# Patient Record
Sex: Female | Born: 1980 | Hispanic: Yes | Marital: Married | Smoking: Never smoker
Health system: Southern US, Community
[De-identification: ages and names within clinical notes are randomized; demographics above are authoritative.]

## PROBLEM LIST (undated history)

## (undated) DIAGNOSIS — R9389 Abnormal findings on diagnostic imaging of other specified body structures: Secondary | ICD-10-CM

## (undated) DIAGNOSIS — O24419 Gestational diabetes mellitus in pregnancy, unspecified control: Secondary | ICD-10-CM

## (undated) HISTORY — DX: Gestational diabetes mellitus in pregnancy, unspecified control: O24.419

## (undated) HISTORY — PX: NO PAST SURGERIES: SHX2092

---
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
## 2002-08-31 ENCOUNTER — Encounter: Admission: RE | Admit: 2002-08-31 | Discharge: 2002-08-31 | Payer: Self-pay | Admitting: Family Medicine

## 2002-09-04 ENCOUNTER — Encounter: Admission: RE | Admit: 2002-09-04 | Discharge: 2002-09-04 | Payer: Self-pay | Admitting: Family Medicine

## 2002-09-13 ENCOUNTER — Encounter (INDEPENDENT_AMBULATORY_CARE_PROVIDER_SITE_OTHER): Payer: Self-pay | Admitting: *Deleted

## 2002-09-13 LAB — CONVERTED CEMR LAB

## 2002-10-12 ENCOUNTER — Ambulatory Visit (HOSPITAL_COMMUNITY): Admission: RE | Admit: 2002-10-12 | Discharge: 2002-10-12 | Payer: Self-pay | Admitting: Family Medicine

## 2002-11-03 ENCOUNTER — Encounter: Admission: RE | Admit: 2002-11-03 | Discharge: 2002-11-03 | Payer: Self-pay | Admitting: Family Medicine

## 2002-12-04 ENCOUNTER — Encounter: Admission: RE | Admit: 2002-12-04 | Discharge: 2002-12-04 | Payer: Self-pay | Admitting: Family Medicine

## 2002-12-05 ENCOUNTER — Encounter: Admission: RE | Admit: 2002-12-05 | Discharge: 2002-12-05 | Payer: Self-pay | Admitting: Family Medicine

## 2003-01-04 ENCOUNTER — Encounter: Admission: RE | Admit: 2003-01-04 | Discharge: 2003-01-04 | Payer: Self-pay | Admitting: Family Medicine

## 2003-01-15 ENCOUNTER — Encounter: Admission: RE | Admit: 2003-01-15 | Discharge: 2003-01-15 | Payer: Self-pay | Admitting: Pediatrics

## 2003-01-29 ENCOUNTER — Ambulatory Visit (HOSPITAL_COMMUNITY): Admission: RE | Admit: 2003-01-29 | Discharge: 2003-01-29 | Payer: Self-pay | Admitting: Family Medicine

## 2003-01-29 ENCOUNTER — Encounter: Admission: RE | Admit: 2003-01-29 | Discharge: 2003-01-29 | Payer: Self-pay | Admitting: Family Medicine

## 2003-02-07 ENCOUNTER — Encounter: Admission: RE | Admit: 2003-02-07 | Discharge: 2003-02-07 | Payer: Self-pay | Admitting: Family Medicine

## 2003-02-21 ENCOUNTER — Encounter: Admission: RE | Admit: 2003-02-21 | Discharge: 2003-02-21 | Payer: Self-pay | Admitting: Family Medicine

## 2003-02-28 ENCOUNTER — Encounter: Admission: RE | Admit: 2003-02-28 | Discharge: 2003-02-28 | Payer: Self-pay | Admitting: Family Medicine

## 2003-03-07 ENCOUNTER — Encounter: Admission: RE | Admit: 2003-03-07 | Discharge: 2003-03-07 | Payer: Self-pay | Admitting: Family Medicine

## 2003-03-14 ENCOUNTER — Encounter: Admission: RE | Admit: 2003-03-14 | Discharge: 2003-03-14 | Payer: Self-pay | Admitting: Family Medicine

## 2003-03-20 ENCOUNTER — Inpatient Hospital Stay (HOSPITAL_COMMUNITY): Admission: AD | Admit: 2003-03-20 | Discharge: 2003-03-20 | Payer: Self-pay | Admitting: Obstetrics and Gynecology

## 2003-03-20 ENCOUNTER — Encounter: Admission: RE | Admit: 2003-03-20 | Discharge: 2003-03-20 | Payer: Self-pay | Admitting: Sports Medicine

## 2003-03-26 ENCOUNTER — Encounter (HOSPITAL_COMMUNITY): Admission: RE | Admit: 2003-03-26 | Discharge: 2003-03-26 | Payer: Self-pay | Admitting: *Deleted

## 2003-03-27 ENCOUNTER — Encounter: Admission: RE | Admit: 2003-03-27 | Discharge: 2003-03-27 | Payer: Self-pay | Admitting: Family Medicine

## 2003-03-28 ENCOUNTER — Inpatient Hospital Stay (HOSPITAL_COMMUNITY): Admission: AD | Admit: 2003-03-28 | Discharge: 2003-03-31 | Payer: Self-pay | Admitting: Obstetrics and Gynecology

## 2003-05-17 ENCOUNTER — Encounter: Admission: RE | Admit: 2003-05-17 | Discharge: 2003-05-17 | Payer: Self-pay | Admitting: Sports Medicine

## 2004-09-02 ENCOUNTER — Ambulatory Visit: Payer: Self-pay | Admitting: Family Medicine

## 2007-02-10 DIAGNOSIS — R002 Palpitations: Secondary | ICD-10-CM | POA: Insufficient documentation

## 2007-02-11 ENCOUNTER — Encounter (INDEPENDENT_AMBULATORY_CARE_PROVIDER_SITE_OTHER): Payer: Self-pay | Admitting: *Deleted

## 2009-12-14 DIAGNOSIS — O24419 Gestational diabetes mellitus in pregnancy, unspecified control: Secondary | ICD-10-CM

## 2009-12-14 HISTORY — DX: Gestational diabetes mellitus in pregnancy, unspecified control: O24.419

## 2009-12-30 ENCOUNTER — Encounter: Admission: RE | Admit: 2009-12-30 | Discharge: 2010-03-30 | Payer: Self-pay | Admitting: Obstetrics & Gynecology

## 2009-12-30 ENCOUNTER — Ambulatory Visit (HOSPITAL_COMMUNITY)
Admission: RE | Admit: 2009-12-30 | Discharge: 2009-12-30 | Payer: Self-pay | Source: Home / Self Care | Admitting: Obstetrics & Gynecology

## 2009-12-30 ENCOUNTER — Ambulatory Visit: Payer: Self-pay | Admitting: Obstetrics & Gynecology

## 2010-01-06 ENCOUNTER — Ambulatory Visit: Payer: Self-pay | Admitting: Obstetrics & Gynecology

## 2010-01-20 ENCOUNTER — Ambulatory Visit: Payer: Self-pay | Admitting: Obstetrics & Gynecology

## 2010-01-21 ENCOUNTER — Ambulatory Visit (HOSPITAL_COMMUNITY): Admission: RE | Admit: 2010-01-21 | Discharge: 2010-01-21 | Payer: Self-pay | Admitting: Family Medicine

## 2010-02-10 ENCOUNTER — Ambulatory Visit: Payer: Self-pay | Admitting: Obstetrics & Gynecology

## 2010-02-24 ENCOUNTER — Ambulatory Visit: Payer: Self-pay | Admitting: Obstetrics & Gynecology

## 2010-03-10 ENCOUNTER — Ambulatory Visit: Payer: Self-pay | Admitting: Obstetrics & Gynecology

## 2010-03-10 ENCOUNTER — Encounter: Payer: Self-pay | Admitting: Obstetrics & Gynecology

## 2010-03-10 LAB — CONVERTED CEMR LAB
Hemoglobin: 11.6 g/dL — ABNORMAL LOW (ref 12.0–15.0)
MCHC: 32.5 g/dL (ref 30.0–36.0)
Platelets: 140 10*3/uL — ABNORMAL LOW (ref 150–400)
WBC: 8.4 10*3/uL (ref 4.0–10.5)

## 2010-03-17 ENCOUNTER — Ambulatory Visit (HOSPITAL_COMMUNITY): Admission: RE | Admit: 2010-03-17 | Discharge: 2010-03-17 | Payer: Self-pay | Admitting: Family Medicine

## 2010-03-24 ENCOUNTER — Ambulatory Visit: Payer: Self-pay | Admitting: Obstetrics & Gynecology

## 2010-03-31 ENCOUNTER — Ambulatory Visit: Payer: Self-pay | Admitting: Obstetrics & Gynecology

## 2010-04-14 ENCOUNTER — Ambulatory Visit (HOSPITAL_COMMUNITY): Admission: RE | Admit: 2010-04-14 | Discharge: 2010-04-14 | Payer: Self-pay | Admitting: Family Medicine

## 2010-04-14 ENCOUNTER — Ambulatory Visit: Payer: Self-pay | Admitting: Obstetrics & Gynecology

## 2010-04-17 ENCOUNTER — Ambulatory Visit: Payer: Self-pay | Admitting: Family Medicine

## 2010-04-21 ENCOUNTER — Ambulatory Visit: Payer: Self-pay | Admitting: Obstetrics & Gynecology

## 2010-04-24 ENCOUNTER — Ambulatory Visit: Payer: Self-pay | Admitting: Obstetrics & Gynecology

## 2010-04-28 ENCOUNTER — Ambulatory Visit: Payer: Self-pay | Admitting: Obstetrics & Gynecology

## 2010-05-01 ENCOUNTER — Ambulatory Visit: Payer: Self-pay | Admitting: Obstetrics & Gynecology

## 2010-05-05 ENCOUNTER — Encounter: Admission: RE | Admit: 2010-05-05 | Discharge: 2010-05-05 | Payer: Self-pay | Admitting: Obstetrics & Gynecology

## 2010-05-05 ENCOUNTER — Ambulatory Visit: Payer: Self-pay | Admitting: Obstetrics & Gynecology

## 2010-05-06 ENCOUNTER — Encounter: Payer: Self-pay | Admitting: Obstetrics & Gynecology

## 2010-05-08 ENCOUNTER — Ambulatory Visit: Payer: Self-pay | Admitting: Obstetrics & Gynecology

## 2010-05-13 ENCOUNTER — Ambulatory Visit: Payer: Self-pay | Admitting: Obstetrics & Gynecology

## 2010-05-15 ENCOUNTER — Ambulatory Visit: Payer: Self-pay | Admitting: Obstetrics & Gynecology

## 2010-05-19 ENCOUNTER — Ambulatory Visit: Payer: Self-pay | Admitting: Obstetrics & Gynecology

## 2010-05-22 ENCOUNTER — Ambulatory Visit: Payer: Self-pay | Admitting: Obstetrics & Gynecology

## 2010-05-26 ENCOUNTER — Ambulatory Visit (HOSPITAL_COMMUNITY): Admission: RE | Admit: 2010-05-26 | Discharge: 2010-05-26 | Payer: Self-pay | Admitting: Family Medicine

## 2010-05-26 ENCOUNTER — Ambulatory Visit: Payer: Self-pay | Admitting: Obstetrics & Gynecology

## 2010-05-29 ENCOUNTER — Ambulatory Visit: Payer: Self-pay | Admitting: Obstetrics & Gynecology

## 2010-05-31 ENCOUNTER — Ambulatory Visit: Payer: Self-pay | Admitting: Family

## 2010-05-31 ENCOUNTER — Inpatient Hospital Stay (HOSPITAL_COMMUNITY): Admission: AD | Admit: 2010-05-31 | Discharge: 2010-06-02 | Payer: Self-pay | Admitting: Family Medicine

## 2010-07-10 ENCOUNTER — Ambulatory Visit: Payer: Self-pay | Admitting: Obstetrics & Gynecology

## 2010-07-16 ENCOUNTER — Ambulatory Visit: Payer: Self-pay | Admitting: Obstetrics and Gynecology

## 2010-12-31 ENCOUNTER — Ambulatory Visit: Admit: 2010-12-31 | Payer: Self-pay | Admitting: Obstetrics & Gynecology

## 2011-03-01 LAB — CBC
HCT: 34.6 % — ABNORMAL LOW (ref 36.0–46.0)
MCHC: 34 g/dL (ref 30.0–36.0)
MCV: 87.3 fL (ref 78.0–100.0)
RDW: 15.3 % (ref 11.5–15.5)
WBC: 8.2 10*3/uL (ref 4.0–10.5)

## 2011-03-01 LAB — POCT URINALYSIS DIP (DEVICE)
Bilirubin Urine: NEGATIVE
Bilirubin Urine: NEGATIVE
Glucose, UA: NEGATIVE mg/dL
Hgb urine dipstick: NEGATIVE
Nitrite: NEGATIVE
Protein, ur: 30 mg/dL — AB
Protein, ur: NEGATIVE mg/dL
Specific Gravity, Urine: 1.015 (ref 1.005–1.030)
Urobilinogen, UA: 0.2 mg/dL (ref 0.0–1.0)
pH: 7 (ref 5.0–8.0)

## 2011-03-01 LAB — GLUCOSE, CAPILLARY: Glucose-Capillary: 135 mg/dL — ABNORMAL HIGH (ref 70–99)

## 2011-03-01 LAB — RPR: RPR Ser Ql: NONREACTIVE

## 2011-03-02 LAB — POCT URINALYSIS DIP (DEVICE)
Bilirubin Urine: NEGATIVE
Bilirubin Urine: NEGATIVE
Bilirubin Urine: NEGATIVE
Glucose, UA: NEGATIVE mg/dL
Glucose, UA: NEGATIVE mg/dL
Hgb urine dipstick: NEGATIVE
Ketones, ur: NEGATIVE mg/dL
Nitrite: NEGATIVE
Protein, ur: NEGATIVE mg/dL
Protein, ur: NEGATIVE mg/dL
Specific Gravity, Urine: 1.015 (ref 1.005–1.030)
Specific Gravity, Urine: 1.02 (ref 1.005–1.030)
Urobilinogen, UA: 0.2 mg/dL (ref 0.0–1.0)
Urobilinogen, UA: 0.2 mg/dL (ref 0.0–1.0)
pH: 7 (ref 5.0–8.0)
pH: 7 (ref 5.0–8.0)
pH: 7 (ref 5.0–8.0)

## 2011-03-03 LAB — POCT URINALYSIS DIP (DEVICE)
Bilirubin Urine: NEGATIVE
Bilirubin Urine: NEGATIVE
Glucose, UA: 100 mg/dL — AB
Glucose, UA: 100 mg/dL — AB
Hgb urine dipstick: NEGATIVE
Hgb urine dipstick: NEGATIVE
Ketones, ur: NEGATIVE mg/dL
Ketones, ur: NEGATIVE mg/dL
Ketones, ur: NEGATIVE mg/dL
Nitrite: NEGATIVE
Nitrite: NEGATIVE
Nitrite: NEGATIVE
Protein, ur: 30 mg/dL — AB
Protein, ur: 30 mg/dL — AB
Specific Gravity, Urine: 1.025 (ref 1.005–1.030)
Specific Gravity, Urine: >=1.03 (ref 1.005–1.030)
Urobilinogen, UA: 0.2 mg/dL (ref 0.0–1.0)
Urobilinogen, UA: 0.2 mg/dL (ref 0.0–1.0)
pH: 6 (ref 5.0–8.0)
pH: 6 (ref 5.0–8.0)
pH: 7 (ref 5.0–8.0)

## 2011-03-04 LAB — POCT URINALYSIS DIP (DEVICE)
Glucose, UA: NEGATIVE mg/dL
Hgb urine dipstick: NEGATIVE
Hgb urine dipstick: NEGATIVE
Hgb urine dipstick: NEGATIVE
Ketones, ur: NEGATIVE mg/dL
Ketones, ur: NEGATIVE mg/dL
Nitrite: NEGATIVE
Protein, ur: NEGATIVE mg/dL
Protein, ur: NEGATIVE mg/dL
Protein, ur: NEGATIVE mg/dL
Specific Gravity, Urine: 1.02 (ref 1.005–1.030)
Urobilinogen, UA: 0.2 mg/dL (ref 0.0–1.0)
Urobilinogen, UA: 0.2 mg/dL (ref 0.0–1.0)
Urobilinogen, UA: 0.2 mg/dL (ref 0.0–1.0)
pH: 7 (ref 5.0–8.0)
pH: 7 (ref 5.0–8.0)

## 2011-03-08 LAB — POCT URINALYSIS DIP (DEVICE)
Bilirubin Urine: NEGATIVE
Bilirubin Urine: NEGATIVE
Ketones, ur: NEGATIVE mg/dL
Nitrite: NEGATIVE
Protein, ur: 30 mg/dL — AB
Specific Gravity, Urine: 1.025 (ref 1.005–1.030)
Urobilinogen, UA: 0.2 mg/dL (ref 0.0–1.0)
pH: 6.5 (ref 5.0–8.0)
pH: 7 (ref 5.0–8.0)

## 2011-09-23 IMAGING — US US OB FOLLOW-UP
1 series · 14 of 22 positions shown · non-contrast
Comparison: none

OBSTETRICAL ULTRASOUND:
 This ultrasound exam was performed in the [HOSPITAL] Ultrasound Department.  The OB US report was generated in the AS system, and faxed to the ordering physician.  This report is also available in [HOSPITAL]?s AccessANYware and in [REDACTED] PACS.

[Series 1: us ob follow up · 0.27mm/px · 14 of 22 slices shown]
[im 1/22]
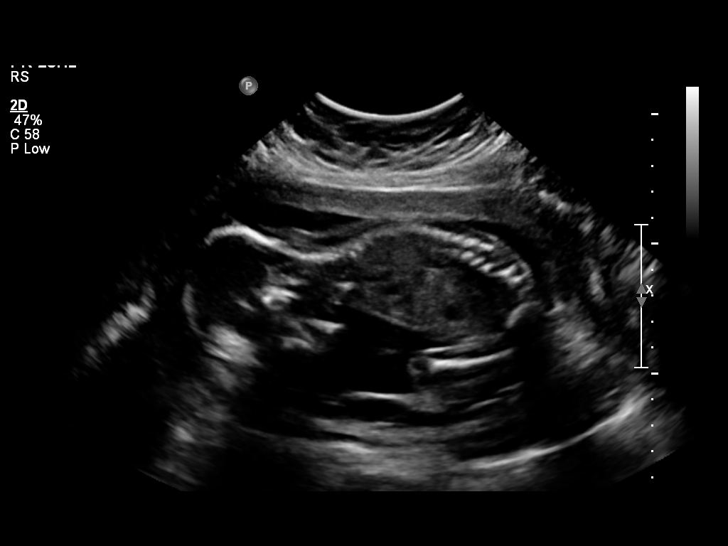
[im 3/22]
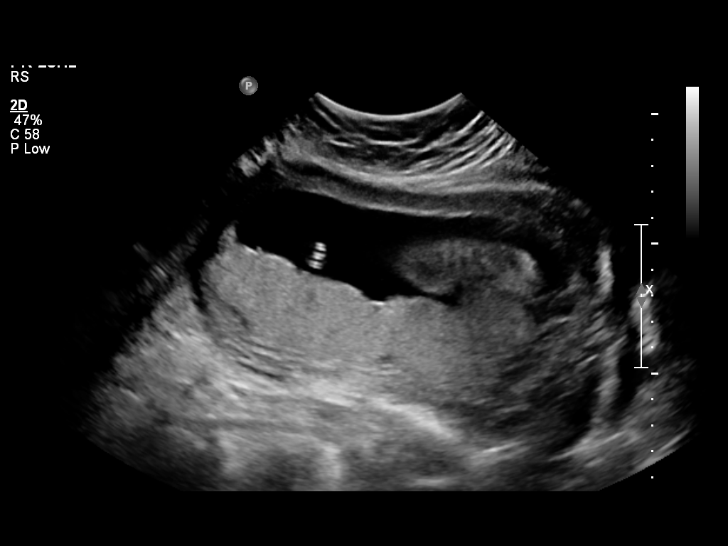
[im 4/22]
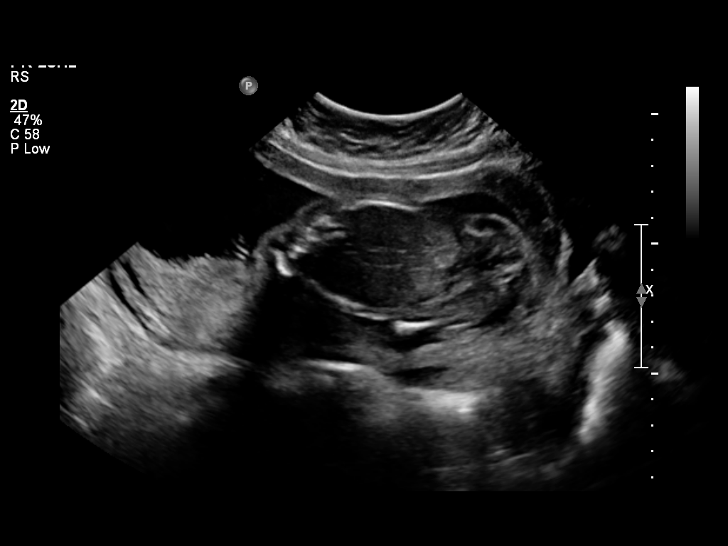
[im 6/22]
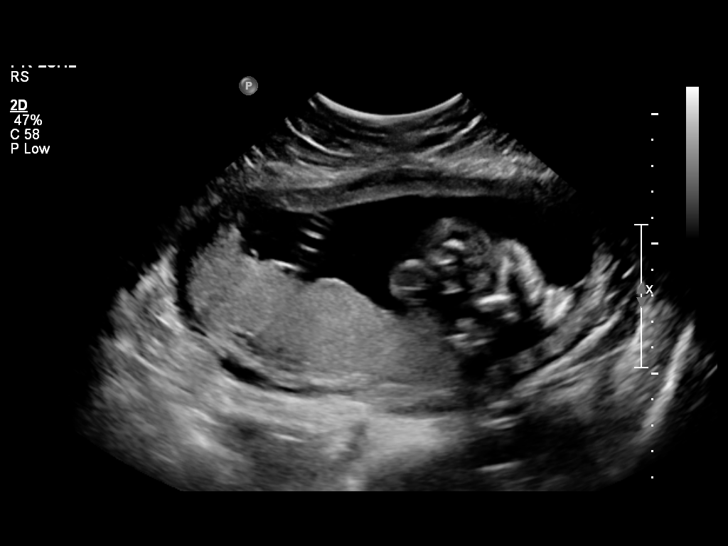
[im 8/22]
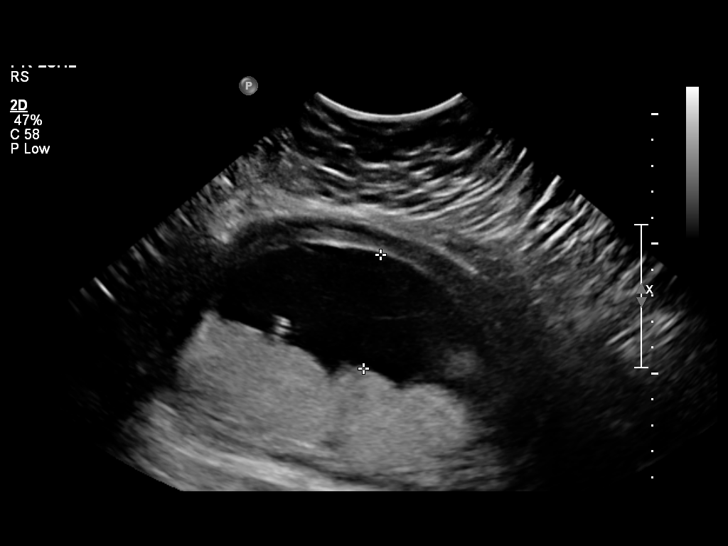
[im 9/22]
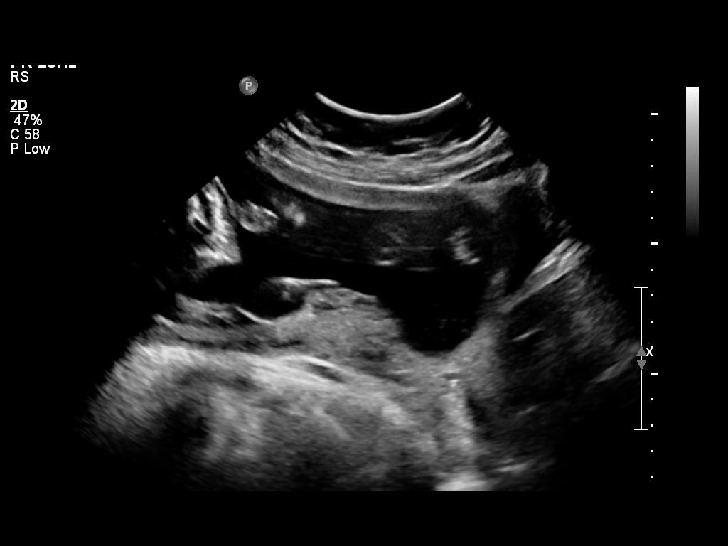
[im 11/22]
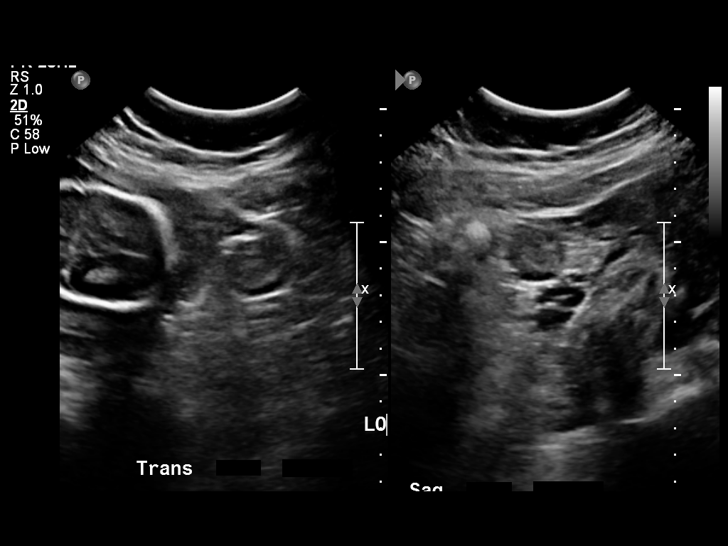
[im 12/22]
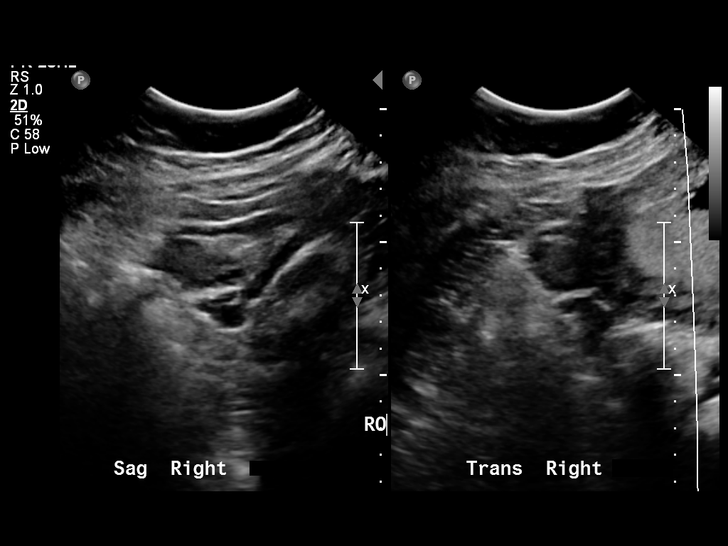
[im 14/22]
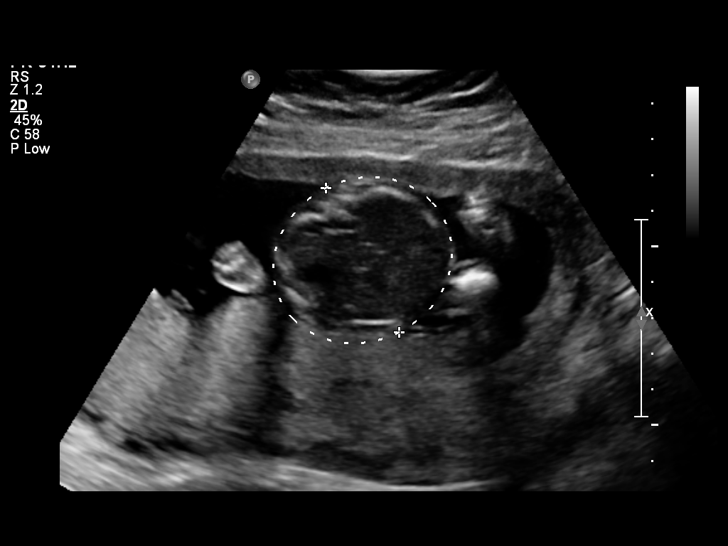
[im 15/22]
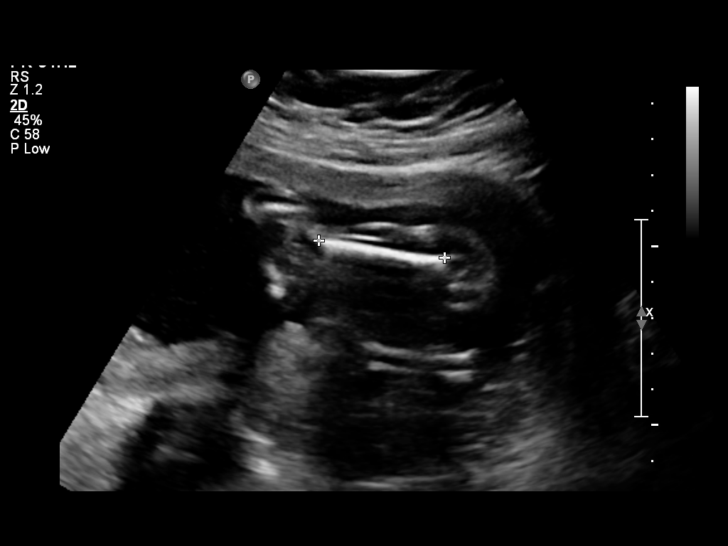
[im 17/22]
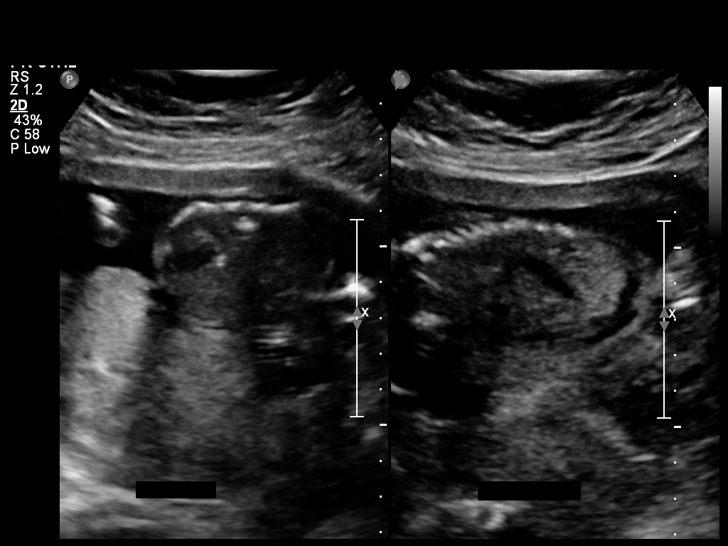
[im 19/22]
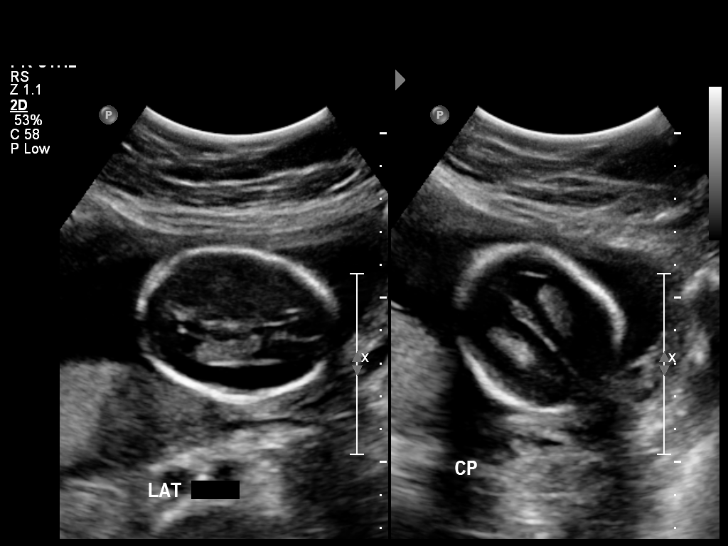
[im 20/22]
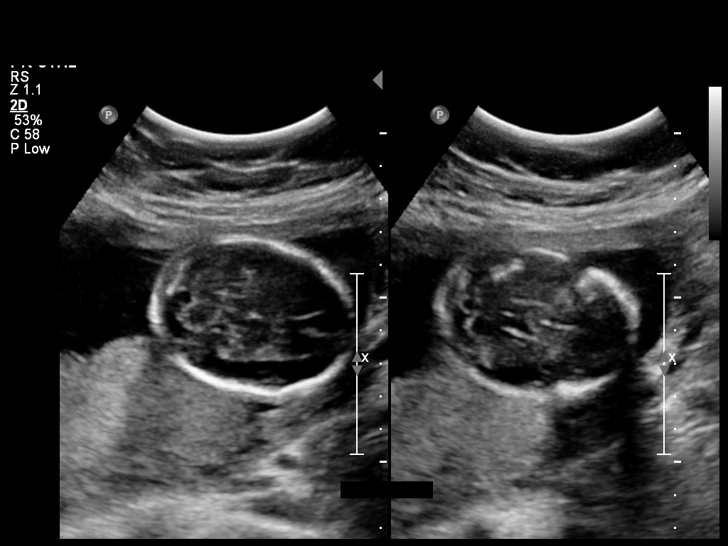
[im 22/22]
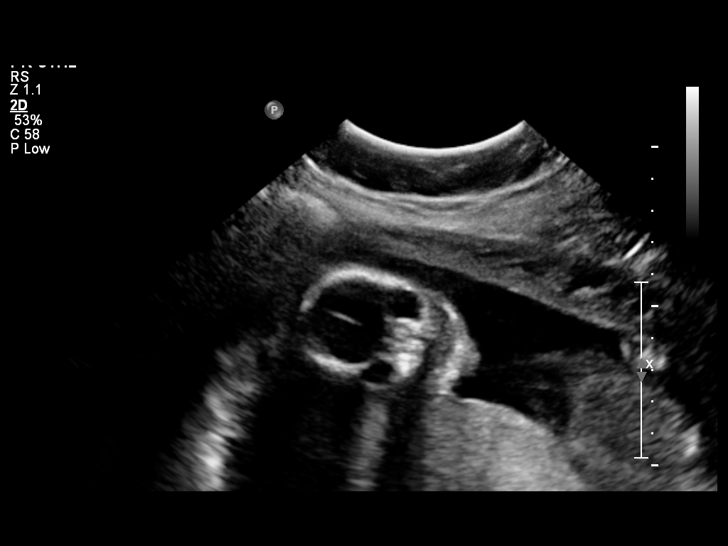

[14 of 22 positions shown; findings below may reference images not displayed]

IMPRESSION: See AS Obstetric US report.

## 2012-05-06 ENCOUNTER — Encounter: Payer: Self-pay | Admitting: Family Medicine

## 2012-05-06 ENCOUNTER — Ambulatory Visit (INDEPENDENT_AMBULATORY_CARE_PROVIDER_SITE_OTHER): Payer: Self-pay | Admitting: Family Medicine

## 2012-05-06 VITALS — BP 109/77 | HR 77 | Wt 160.5 lb

## 2012-05-06 DIAGNOSIS — M25511 Pain in right shoulder: Secondary | ICD-10-CM | POA: Insufficient documentation

## 2012-05-06 DIAGNOSIS — E669 Obesity, unspecified: Secondary | ICD-10-CM | POA: Insufficient documentation

## 2012-05-06 DIAGNOSIS — M25519 Pain in unspecified shoulder: Secondary | ICD-10-CM

## 2012-05-06 NOTE — Progress Notes (Signed)
Subjective:    Patient ID: Miranda Welch, female    DOB: [birth year], 31 y.o.   MRN: [id number]  HPI New patient to establish care. Orange card is pending.  1. Arm pain/numbness. Patient complains of lateral shoulder pain that radiates down to all of her fingers. Present for the last year, stable. Accompanied by numbness and tingling. Exacerbated by certain movements including overhead use and lifting up children. No work with repetitive motions. She denies dropping things, but does feel like the pain worsens with use and she must rest. Denies any trauma, falls, swelling, neck pain, back pain.  2. Hx GDM. Patient had gestational diabetes during last day in 2011. Has family history of diabetes in mother and father. Patient denies any polyuria, polydipsia, fatigue.  3. Obesity. Prior to her second pregnancy she weighed 149 pounds. At her heaviest she was 75, has lost approximately 10 pounds since giving birth 2 years ago. Has recently started walking for exercise. She is not eating a healthy diet, stopped her healthy diet after treatment for gestational diabetes was finished.  Past Medical History  Diagnosis Date  . Gestational diabetes 2011   History reviewed. No pertinent past surgical history.  History   Social History  . Marital Status: Married    Spouse Name: N/A    Number of Children: N/A  . Years of Education: N/A   Occupational History  . Not on file.   Social History Main Topics  . Smoking status: Never Smoker   . Smokeless tobacco: Not on file  . Alcohol Use: No  . Drug Use: No  . Sexually Active: Yes   Other Topics Concern  . Not on file   Social History Narrative   Moved from [place] 10 yrs ago, lives with husband and 2 daughters (2011, 2004). Stay at home mother. Walks for exercise irregularly.   Family History  Problem Relation Age of Onset  . Diabetes Mother   . Hypertension Mother   . Diabetes Father   . Hypertension Father   . Depression Sister     . Hypertension Sister   . Cancer Maternal Uncle     colon  . Diabetes Maternal Grandmother   . Hypertension Maternal Grandfather     Review of Systems See HPI otherwise negative.    Objective:   Physical Exam  Vitals reviewed. Constitutional: She is oriented to person, place, and time. She appears well-developed and well-nourished. No distress.  HENT:  Head: Normocephalic and atraumatic.  Eyes: EOM are normal. Pupils are equal, round, and reactive to light.  Neck: Normal range of motion. Neck supple.  Cardiovascular: Normal rate, regular rhythm, normal heart sounds and intact distal pulses.   No murmur heard. Pulmonary/Chest: Effort normal and breath sounds normal. No respiratory distress. She has no wheezes. She has no rales.  Abdominal: Soft.  Musculoskeletal: Normal range of motion. She exhibits no edema.       Normal shoulder and cervical spine range of motion.  Patient notes mild pain lateral shoulder with overhead reaching and abduction. Tender to palpation in the lateral and anterior aspect of humeral head/shoulder. Has mild pain with active resistance and empty can position, lift off.  Normal strength in biceps flexion, wrist extension and flexion, grip testing. Sensation intact throughout.  Lymphadenopathy:    She has no cervical adenopathy.  Neurological: She is alert and oriented to person, place, and time. Coordination normal.  Psychiatric: She has a normal mood and affect.  Assessment & Plan:

## 2012-05-06 NOTE — Assessment & Plan Note (Signed)
Physical exam most consistent with rotator cuff tendinopathy/shoulder impingement. Other possibilities include cervical radiculopathy or carpal tunnel syndrome with a component numbness and tingling been present. Patient has not tried any modalities for therapy. She is awaiting Orange card approval. Will start with the nature and and have provided some stretching/strengthening for rotator cuff currently. The patient is able to followup with like to obtain plain film of the neck and shoulder to evaluate for radiculopathy versus bone pathology. Possible interventions include referral for physical therapy, sports medicine referral, trial of subacromial injection. Plan to check B12, TSH levels with neuropathy symptoms.

## 2012-05-06 NOTE — Assessment & Plan Note (Signed)
Patient risk for glucose intolerance. Will await financial coverage, plan to obtain baseline labs including A1c, lipid level, TSH. Encouraged patient on recently starting walking for exercise. May also benefit from nutrition referral as she seemed to do well with strict dietary recommendations during her pregnancy.

## 2012-05-06 NOTE — Patient Instructions (Signed)
Nice to meet you. I think your should pain may be due to muscle or tendon impingement/strain.  You may try tylenol or motrin (ibuprofen 400-600mg  three times daily) for the pain.  Please come back after you get orange card so we may order tests and try physical therapy. We will also check lab work to make sure no diabetes. Keep using arm for work and exercising daily.  Ejercicios para los hombros (Shoulder Exercises) EJERCICIOS EJERCICIOS DE AMPLITUD DE MOVIMIENTOS Y ELONGACIN Estos ejercicios le ayudarn en la recuperacin de la lesin. Los sntomas podrn aliviarse con o sin asistencia adicional de su mdico, fisioterapeuta o Herbalist. Al completar estos ejercicios, recuerde:   Restaurar la flexibilidad del tejido ayuda a que las articulaciones recuperen el movimiento normal. Esto permite que el movimiento y la actividad sea ms saludables y menos dolorosos.   Para que sea efectiva, cada elongacin debe realizarse durante al menos 30 segundos.   La elongacin nunca debe ser dolorosa. Deber sentir slo un alargamiento suave o elongacin del tejido que estira.  AMPLITUD DE MOVIMIENTOS Pndulo  Inclnese a nivel de la cintura de modo que su brazo derecha / izquierdo se separe del cuerpo. Sostngase con la otra mano sobre una superficie slida, como una mesa o mesada de cocina.   Su brazo derecha / izquierdo debe quedar perpendicular al [employer]. Si no est perpendicular, debe inclinarse un poco ms. Relaje los msculos del brazo y el hombro derecha / izquierdo tanto como pueda.   Menee suavemente las caderas y el tronco de modo que su brazo Sales executive / izquierdo se mueva sin Chemical engineer los msculos del hombro derecha / izquierdo.   Aumente los movimientos de modo que su brazo Sales executive / izquierdo se mueva de un lado a otro, luego hacia adelante y [place] atrs y finalmente en sentido de las agujas del reloj y Engineer, mining en sentido contrario a las mismas.   Haga __________ repeticiones en cada  direccin. Muchas personas Retail buyer ejercicio para [employer], y para aumentar la amplitud de movimientos.  Reptalo __________ veces. Realice este ejercicio __________ veces por da. ELONGACIN Flexin de pie  Prese en una postura correcta. Tome un palo de escoba o caa con una palma derecha / izquierdo hacia abajo y la otra palma hacia arriba de modo que sus manos tengan una separacin de un poco ms que el ancho de sus hombros.   Mantenga su codo derecha / izquierdo recto y los msculos de los hombros [place], y [place] palo con su manos opuesta para elevar su brazo derecha / izquierdo frente a su cuerpo y [employer] cabeza. Eleve el brazo [employer] sentir un estiramiento en su hombro derecha / izquierdo, pero detngase antes de sentir que Teacher, music.   Evite encoger su hombro derecha / izquierdo cuando eleve el brazo manteniendo el omplato hacia abajo y hacia la columna vertebral en la zona media de la espalda. Mantenga esta posicin durante __________ segundos.   Vuelva lentamente a la posicin inicial.  Reptalo __________ veces. Realice este ejercicio __________ veces por da.  ELONGACIN Rotacin interna  Coloque la mano derecha / izquierdo detrs de la espalda con las palmas [place] arriba.   Coloque una toalla o cinturn sobre el hombro opuesto. Tome la toalla o el cinturn con su mano derecha / izquierdo.   Manteniendo una postura erguida, tire suavemente de la toalla o cinturn hasta sentir un estiramiento en la parte frontal del hombroderecha / izquierdo  Evite encoger su hombro derecha / izquierdo cuando eleve el brazo manteniendo el omplato hacia abajo y hacia la columna vertebral en la zona media de la espalda.   Mantenga __________. Afloje la tensin bajando la mano opuesta.  Reptalo __________ veces. Realice este ejercicio __________ veces por da.  ELONGACIN Rotacin externa y abduccin  Afrmese en el marco de [place]. No  importa cul pie coloca adelante.   Segn lo que le indique el mdico, el fisioterapeuta o Orthoptist, [employer] las manos:   y antebrazos por arriba de la cabeza y [place] marco de la puerta.   y antebrazos a la altura de la cabeza y sobre el marco de la puerta.   a la altura del codo y sobre el marco de la puerta.   Manteniendo la cabeza y el pecho erguidos y los msculos del estmago tensos para Automotive engineer la sobreextensin de la cintura, lleve lentamente el peso al pie que est por delante hasta sentir un estiramiento en el trax y en la zona anterior de los hombros.   Mantenga esta posicin durante __________ segundos. Lleve el peso hacia el pie que est por detrs para Higher education careers adviser.  Reptalo __________ veces. Realice este estiramiento __________ Anthoney Harada por da.  EJERCICIOS DE FORTALECIMIENTO Estos ejercicios le ayudarn en la recuperacin de la lesin. Los sntomas podrn desaparecer con o sin mayor intervencin del profesional, el fisioterapeuta o Orthoptist. Al completar estos ejercicios, recuerde:   Los msculos pueden ganar tanto la resistencia como la fuerza que necesita para sus actividades diarias a travs de ejercicios controlados.   Realice los ejercicios como se lo indic el mdico, el fisioterapeuta o Orthoptist. Aumente la resistencia y las repeticiones segn se le haya indicado.   Podr experimentar dolor o cansancio muscular, pero el dolor o molestia que trata de eliminar a travs de los ejercicios nunca debe empeorar. Si el dolor empeora, detngase y asegrese de que est siguiendo las directivas correctamente. Si an siente dolor luego de Education officer, environmental lo ajustes necesarios, deber discontinuar el ejercicio hasta que pueda conversar con el profesional sobre el problema.   Durante la recuperacin, si el mdico as se lo indica, evite las actividades o ejercicios que impliquen acciones que le obliguen a Scientific laboratory technician la mano lesionada o el codo Sales executive / izquierdo  encima de la cabeza o detrs de la espalda o la cabeza. Estas posturas tensionan los tejidos que estn tratando de curarse.  FUERZA Depresin y aduccin escapular  Sintese con una buena postura en una silla firme. Apoye los brazos delante de usted, con [place], apoyabrazos, o sobre una mesa. Mantenga los codos en lnea con los lados de su cuerpo.   Lleve suavemente los omplatos hacia abajo y [place] su columna vertebral. Aumente gradualmente la tensin, sin tensar los msculos de la parte superior de los hombros y la parte posterior de su cuello.   Mantenga esta posicin durante __________ segundos. Libere lentamente la tensin y relaje los msculos antes de iniciar la siguiente repeticin.   Despus de haber practicado este ejercicio, quite el soporte del brazo y complete el ejercicio de pie, y tambin en la posicin de sentado.  Reptalo __________ veces. Realice este ejercicio __________ veces por da.  FUERZA Rotadores externos  Asegure una banda o tubo de goma a un objeto fijo de modo que quede a la misma altura que su codo Sales executive / izquierdo Engineer, manufacturing systems se encuentre de pie o sentado sobre una superficie firme.   Prese o sintese  de [employer]	[place] banda de goma se encuentre de su lado no lesionado.   Doble el codo a 90 grados. Coloque una toalla doblada o una pequea almohada debajo de su brazo derecha / izquierdo de modo que su codo quede algunas pulgadas separado de su cuerpo.   Manteniendo la tensin en la banda de goma, tire de la misma hacia afuera de su cuerpo, como pivoteando en el codo. Asegrese de [employer] su cuerpo derecho, de modo que el movimiento slo provenga de la rotacin del hombro.   Mantenga esta posicin durante __________ segundos. Libere la tensin de modo controlado mientras vuelve a la posicin inicial.  Reptalo __________ veces. Realice este estiramiento __________ Anthoney Harada por da.  FUERZA - Supraespinoso  Prese o sintese en una postura correcta. Sostenga un  peso de __________ Cheron Schaumann banda de goma para ejercicios, de modo que su mano quede con el pulgar hacia arriba, como cuando [employer].   Levante lentamente la mano derecha / izquierdo en el aire, movindola alrededor de 30 grados hacia un lado. Levante la mano [employer] altura del hombro o tanto como pueda, sin Tax inspector. Al principio, muchas personas no pueden levantar las manos por arriba de la altura del hombro.   Evite encoger su hombro derecha / izquierdo cuando eleve el brazo manteniendo el omplato hacia abajo y hacia la columna vertebral en la zona media de la espalda.   Mantenga esta posicin durante __________ segundos. Controle el descenso de la mano de modo que vuelva a la posicin inicial lo ms lentamente posible.  Reptalo __________ veces. Realice este ejercicio __________ veces por da.  FUERZA Extensores del hombro  Asegure una banda o tubo de goma a un objeto fijo (mesa, columna) de modo que quede a la misma altura que sus hombros mientras se encuentre de pie o sentado en una silla firme sin apoyabrazos.   Con los pulgares [place], tome un extremo de la banda con cada mano. Enderece los codos y levante las manos directamente frente usted, a la altura de los hombros. [place] hacia atrs, alejndose del extremo fijo de la banda, [place] que se tense.   Presionando los hombros para juntarlos, [employer] a los lados de los muslos. No deje que las manos vayan ms all.   Mantenga esta posicin durante __________ segundos. Lentamente libere la tensin de la banda, volviendo a la posicin inicial.  Reptalo __________ veces. Realice este ejercicio __________ veces por da.  FUERZA - Retractores escapulares  Asegure una banda o tubo de goma a un objeto fijo (mesa, columna) de modo que quede a la misma altura que sus hombros mientras se encuentre de pie o sentado en una silla firme sin apoyabrazos.   Con las palmas [place], tome un extremo de la banda con cada  mano. Enderece los codos y levante las manos directamente frente usted, a la altura de los hombros. [place] hacia atrs, alejndose del extremo fijo de la banda, [place] que se tense.   Tratando de juntar presionando los omplatos, lleve los codos hacia atrs [employer] dobla. Mantenga los brazos separados del cuerpo durante todo el ejercicio.   Mantenga esta posicin durante __________ segundos. Lentamente libere la tensin de la banda, volviendo a la posicin inicial.  Reptalo __________ veces. Realice este ejercicio __________ veces por da. FUERZA Depresores escapulares  Busque una silla maciza sin ruedas, como una silla de comedor.   Manteniendo los pies sobre el piso, levante las nalgas del  asiento y trabe los codos.   Manteniendo los codos rectos, deje que la gravedad empuje su cuerpo [place] abajo. Los hombros se elevarn [employer].   Eleve el cuerpo oponindose a la gravedad, llevando los omplatos hacia la espalda y acortando la distancia entre los hombros y [place]. Aunque los pies mantengan contacto con el piso, deben soportar cada vez menos peso, a medida que se fortalece.   Mantenga esta posicin durante __________ segundos. De modo lento y controlado, baje el cuerpo para repetir.  Reptalo __________ veces. Realice este ejercicio __________ veces por da.  Document Released: 05/24/2006 Document Revised: 11/19/2011 [facility] Patient Information 2012 [place], [place].

## 2012-12-15 ENCOUNTER — Other Ambulatory Visit: Payer: Self-pay

## 2013-08-08 LAB — OB RESULTS CONSOLE GC/CHLAMYDIA
Chlamydia: NEGATIVE
GC PROBE AMP, GENITAL: NEGATIVE

## 2013-08-08 LAB — OB RESULTS CONSOLE RPR: RPR: NONREACTIVE

## 2013-08-08 LAB — OB RESULTS CONSOLE HEPATITIS B SURFACE ANTIGEN: Hepatitis B Surface Ag: NEGATIVE

## 2013-08-08 LAB — OB RESULTS CONSOLE RUBELLA ANTIBODY, IGM: RUBELLA: IMMUNE

## 2013-08-08 LAB — OB RESULTS CONSOLE ANTIBODY SCREEN: ANTIBODY SCREEN: NEGATIVE

## 2013-08-08 LAB — OB RESULTS CONSOLE HIV ANTIBODY (ROUTINE TESTING): HIV: NONREACTIVE

## 2013-08-08 LAB — OB RESULTS CONSOLE ABO/RH: RH Type: POSITIVE

## 2013-11-23 ENCOUNTER — Encounter: Payer: Medicaid Other | Attending: Family Medicine | Admitting: *Deleted

## 2013-11-23 ENCOUNTER — Ambulatory Visit (HOSPITAL_COMMUNITY)
Admission: RE | Admit: 2013-11-23 | Discharge: 2013-11-23 | Disposition: A | Discharge status: Home / Self Care | Payer: Medicaid Other | Source: Ambulatory Visit | Attending: Obstetrics | Admitting: Obstetrics

## 2013-11-23 DIAGNOSIS — O99810 Abnormal glucose complicating pregnancy: Secondary | ICD-10-CM | POA: Insufficient documentation

## 2013-11-23 DIAGNOSIS — Z713 Dietary counseling and surveillance: Secondary | ICD-10-CM | POA: Insufficient documentation

## 2013-11-23 NOTE — Consult Note (Signed)
  Patient was seen on 11/23/13 for Gestational Diabetes self-management. The following learning objectives were met by the patient :   States the definition of Gestational Diabetes  States why dietary management is important in controlling blood glucose  Describes the effects of carbohydrates on blood glucose levels  Demonstrates ability to create a balanced meal plan  Demonstrates carbohydrate counting   States when to check blood glucose levels  Demonstrates proper blood glucose monitoring techniques  States the effect of stress and exercise on blood glucose levels  Plan:  Aim for 2 Carb Choices per meal (30 grams) +/- 1 either way for breakfast Aim for 3 Carb Choices per meal (45 grams) +/- 1 either way from lunch and dinner Aim for 1-2 Carbs per snack Begin reading food labels for Total Carbohydrate and sugar grams of foods Consider  increasing your activity level by walking daily as tolerated Begin checking BG before breakfast and 1-2 hours after first bit of breakfast, lunch and dinner after  as directed by MD  Take medication  as directed by MD  Blood glucose monitor given: Will purchase Reli-On brand at [facility] (self pay) Blood glucose reading: 112  Patient instructed to monitor glucose levels: FBS: 60 - <90 2 hour: <120  Patient received the following handouts:  Nutrition Diabetes and Pregnancy  Carbohydrate Counting List  Meal Planning worksheet  Patient will be seen for follow-up as needed.

## 2013-11-28 ENCOUNTER — Ambulatory Visit (HOSPITAL_COMMUNITY): Payer: Medicaid Other | Attending: Obstetrics

## 2013-12-05 ENCOUNTER — Encounter (HOSPITAL_COMMUNITY): Payer: Self-pay

## 2013-12-05 ENCOUNTER — Ambulatory Visit (HOSPITAL_COMMUNITY)
Admission: RE | Admit: 2013-12-05 | Discharge: 2013-12-05 | Disposition: A | Discharge status: Home / Self Care | Payer: Medicaid Other | Source: Ambulatory Visit | Attending: Obstetrics | Admitting: Obstetrics

## 2013-12-05 VITALS — BP 111/58 | HR 87 | Wt 186.0 lb

## 2013-12-05 DIAGNOSIS — O24419 Gestational diabetes mellitus in pregnancy, unspecified control: Secondary | ICD-10-CM

## 2013-12-05 DIAGNOSIS — O99810 Abnormal glucose complicating pregnancy: Secondary | ICD-10-CM | POA: Insufficient documentation

## 2013-12-05 NOTE — Progress Notes (Signed)
MATERNAL FETAL MEDICINE CONSULT  Patient Name: JOSEPHINE WOOLDRIDGE Medical Record Number:  [id number] Date of Birth: [birth year] Requesting Physician Name:  Kathreen Cosier, MD Date of Service: 12/05/2013  Chief Complaint Gestational diabetes  History of Present Illness JARIANA SHUMARD was seen today secondary to gestational diabetes at the request of Kathreen Cosier, MD.  The patient is a 32 y.o. G3P2002 at 30 weeks 6 days who was diagnosed with gestational diabetes approximately 3 weeks ago.  She saw a diabetic nurse educator on 11/23/13 and reviewed dietary management and proper technique and frequency of blood sugar checks.  She returns today for evaluation of her glycemic control and assessment of the need for medication therapy.  Review of Systems Pertinent items are noted in HPI.  Obstetrical History G1 NSVD at term G2 NSVD at term--had GDM requiring glyburide G3 Current  Past Medical and Surgical History The patient has no history of chronic medical diseases or prior surgeries.  Family History The patient and the father of the baby have no family history of mental retardation, birth defects, or genetic diseases.  Social History The patient does not use tobacco, alcohol, or other illicit drugs.   Physical Examination Filed Vitals:   12/05/13 1358  BP: 111/58  Pulse: 87    Assessment and Recommendations 1.  Gestational diabetes.  Ms. Larence Penning has meet with a diabetes educator to discuss dietary modification, blood glucose testing, and goals of diet therapy.  The goals of therapy include fasting blood sugars less than 90 mg/dl and 2-hour postprandial less than 120 mg/dl with avoidance of hypoglycemia.  Review of her glucose log shows nearly all of her 2 hour post-prandial values are within range.  However, nearly all of her fasting values are elevated between 100-120.  Thus, diet therapy has not been successful in adequately controlling Ms. Perez-Garcia's  blood sugar.  I have given her a prescription for glyburide 2.5 mg po qhs.  This can be increased as needed to acheive desired glycemic goals.  If the maximum daily dose of glyburide is reached (10 mg bid) and glycemic control remains inadequate, Ms. Perez-Garcia should be stared on insulin.  She will also require fetal surveillance with serial growth ultrasounds every 4 weeks and twice weekly antepartum testing beginning.  She will return in 2 weeks so her glycemic control can again be assessed and medication adjustments made accordingly.  I spent 30 minutes with Ms. Perez-Garcia today of which 50% was face-to-face counseling.  Thank you for referring Ms. Perez-Garcia to the [facility].  Please do not hesitate to contact us with questions.   Rema Fendt, MD

## 2013-12-05 NOTE — Addendum Note (Signed)
Encounter addended by: Alessandra Bevels. Chase Picket, RN on: 12/05/2013  5:00 PM<BR>     Documentation filed: Charges VN

## 2013-12-07 ENCOUNTER — Other Ambulatory Visit: Payer: Self-pay

## 2013-12-12 ENCOUNTER — Ambulatory Visit (HOSPITAL_COMMUNITY)
Admission: RE | Admit: 2013-12-12 | Discharge: 2013-12-12 | Disposition: A | Discharge status: Home / Self Care | Payer: Medicaid Other | Source: Ambulatory Visit | Attending: Obstetrics | Admitting: Obstetrics

## 2013-12-12 DIAGNOSIS — O99810 Abnormal glucose complicating pregnancy: Secondary | ICD-10-CM | POA: Insufficient documentation

## 2013-12-12 NOTE — Addendum Note (Signed)
Encounter addended by: Ty Hilts, RN on: 12/12/2013  3:06 PM<BR>     Documentation filed: Charges VN

## 2013-12-12 NOTE — Addendum Note (Signed)
Encounter addended by: Ty Hilts, RN on: 12/12/2013  3:00 PM<BR>     Documentation filed: Charges VN

## 2013-12-13 ENCOUNTER — Other Ambulatory Visit (HOSPITAL_COMMUNITY): Payer: Self-pay | Admitting: Obstetrics

## 2013-12-13 DIAGNOSIS — E119 Type 2 diabetes mellitus without complications: Secondary | ICD-10-CM

## 2013-12-14 NOTE — L&D Delivery Note (Signed)
Delivery Note At 3:17 PM a viable female was delivered via Vaginal, Spontaneous Delivery (Presentation: ;  ).  APGAR: , ; weight .   Placenta status: , .  Cord:  with the following complications: .  Cord pH: not done  Anesthesia: Epidural  Episiotomy:  Lacerations:  Suture Repair: 2.0 vicryl Est. Blood Loss (mL):   Mom to postpartum.  Baby to Couplet care / Skin to Skin.  MARSHALL,BERNARD A 01/30/2014, 3:32 PM

## 2013-12-15 ENCOUNTER — Other Ambulatory Visit (HOSPITAL_COMMUNITY): Payer: Self-pay | Admitting: Obstetrics

## 2013-12-15 ENCOUNTER — Ambulatory Visit (HOSPITAL_COMMUNITY)
Admission: RE | Admit: 2013-12-15 | Discharge: 2013-12-15 | Disposition: A | Discharge status: Home / Self Care | Payer: Medicaid Other | Source: Ambulatory Visit | Attending: Sports Medicine | Admitting: Sports Medicine

## 2013-12-15 ENCOUNTER — Encounter (HOSPITAL_COMMUNITY): Payer: Self-pay

## 2013-12-15 ENCOUNTER — Ambulatory Visit (HOSPITAL_COMMUNITY)
Admission: RE | Admit: 2013-12-15 | Discharge: 2013-12-15 | Disposition: A | Discharge status: Home / Self Care | Payer: Medicaid Other | Source: Ambulatory Visit | Attending: Obstetrics | Admitting: Obstetrics

## 2013-12-15 DIAGNOSIS — E119 Type 2 diabetes mellitus without complications: Secondary | ICD-10-CM

## 2013-12-15 DIAGNOSIS — O24919 Unspecified diabetes mellitus in pregnancy, unspecified trimester: Principal | ICD-10-CM

## 2013-12-15 DIAGNOSIS — O99810 Abnormal glucose complicating pregnancy: Secondary | ICD-10-CM | POA: Insufficient documentation

## 2013-12-18 ENCOUNTER — Other Ambulatory Visit (HOSPITAL_COMMUNITY): Payer: Self-pay | Admitting: Obstetrics

## 2013-12-18 DIAGNOSIS — O24919 Unspecified diabetes mellitus in pregnancy, unspecified trimester: Principal | ICD-10-CM

## 2013-12-18 DIAGNOSIS — E119 Type 2 diabetes mellitus without complications: Secondary | ICD-10-CM

## 2013-12-20 ENCOUNTER — Ambulatory Visit (HOSPITAL_COMMUNITY)
Admission: RE | Admit: 2013-12-20 | Discharge: 2013-12-20 | Disposition: A | Discharge status: Home / Self Care | Payer: Medicaid Other | Source: Ambulatory Visit | Attending: Obstetrics | Admitting: Obstetrics

## 2013-12-20 ENCOUNTER — Other Ambulatory Visit (HOSPITAL_COMMUNITY): Payer: Self-pay | Admitting: Obstetrics

## 2013-12-20 DIAGNOSIS — E119 Type 2 diabetes mellitus without complications: Secondary | ICD-10-CM

## 2013-12-20 DIAGNOSIS — O24919 Unspecified diabetes mellitus in pregnancy, unspecified trimester: Principal | ICD-10-CM

## 2013-12-22 ENCOUNTER — Other Ambulatory Visit (HOSPITAL_COMMUNITY): Payer: Self-pay | Admitting: Obstetrics

## 2013-12-22 ENCOUNTER — Ambulatory Visit (HOSPITAL_COMMUNITY)
Admission: RE | Admit: 2013-12-22 | Discharge: 2013-12-22 | Disposition: A | Discharge status: Home / Self Care | Payer: Medicaid Other | Source: Ambulatory Visit | Attending: Obstetrics | Admitting: Obstetrics

## 2013-12-22 DIAGNOSIS — O99810 Abnormal glucose complicating pregnancy: Secondary | ICD-10-CM | POA: Insufficient documentation

## 2013-12-22 DIAGNOSIS — Z3490 Encounter for supervision of normal pregnancy, unspecified, unspecified trimester: Secondary | ICD-10-CM

## 2013-12-26 ENCOUNTER — Other Ambulatory Visit (HOSPITAL_COMMUNITY): Payer: Self-pay | Admitting: Obstetrics

## 2013-12-26 ENCOUNTER — Encounter (HOSPITAL_COMMUNITY): Payer: Self-pay

## 2013-12-26 ENCOUNTER — Ambulatory Visit (HOSPITAL_COMMUNITY)
Admission: RE | Admit: 2013-12-26 | Discharge: 2013-12-26 | Disposition: A | Discharge status: Home / Self Care | Payer: Medicaid Other | Source: Ambulatory Visit | Attending: Obstetrics | Admitting: Obstetrics

## 2013-12-26 DIAGNOSIS — O99810 Abnormal glucose complicating pregnancy: Secondary | ICD-10-CM | POA: Insufficient documentation

## 2013-12-26 DIAGNOSIS — O99210 Obesity complicating pregnancy, unspecified trimester: Secondary | ICD-10-CM

## 2013-12-26 DIAGNOSIS — E669 Obesity, unspecified: Secondary | ICD-10-CM | POA: Insufficient documentation

## 2013-12-26 DIAGNOSIS — Z3689 Encounter for other specified antenatal screening: Secondary | ICD-10-CM

## 2013-12-27 ENCOUNTER — Other Ambulatory Visit (HOSPITAL_COMMUNITY): Payer: Self-pay | Admitting: Obstetrics

## 2013-12-27 DIAGNOSIS — O99810 Abnormal glucose complicating pregnancy: Secondary | ICD-10-CM

## 2013-12-29 ENCOUNTER — Inpatient Hospital Stay (HOSPITAL_COMMUNITY): Admission: RE | Admit: 2013-12-29 | Payer: Self-pay | Source: Ambulatory Visit

## 2013-12-29 ENCOUNTER — Ambulatory Visit (HOSPITAL_COMMUNITY): Payer: Medicaid Other

## 2014-01-02 ENCOUNTER — Other Ambulatory Visit (HOSPITAL_COMMUNITY): Payer: Self-pay | Admitting: Obstetrics

## 2014-01-02 ENCOUNTER — Ambulatory Visit (HOSPITAL_COMMUNITY)
Admission: RE | Admit: 2014-01-02 | Discharge: 2014-01-02 | Disposition: A | Discharge status: Home / Self Care | Payer: Medicaid Other | Source: Ambulatory Visit | Attending: Obstetrics | Admitting: Obstetrics

## 2014-01-02 DIAGNOSIS — Z3689 Encounter for other specified antenatal screening: Secondary | ICD-10-CM | POA: Insufficient documentation

## 2014-01-02 DIAGNOSIS — O99810 Abnormal glucose complicating pregnancy: Secondary | ICD-10-CM | POA: Insufficient documentation

## 2014-01-04 ENCOUNTER — Other Ambulatory Visit: Payer: Self-pay

## 2014-01-05 ENCOUNTER — Ambulatory Visit (HOSPITAL_COMMUNITY)
Admission: RE | Admit: 2014-01-05 | Discharge: 2014-01-05 | Disposition: A | Discharge status: Home / Self Care | Payer: Medicaid Other | Source: Ambulatory Visit | Attending: Obstetrics | Admitting: Obstetrics

## 2014-01-05 ENCOUNTER — Other Ambulatory Visit (HOSPITAL_COMMUNITY): Payer: Self-pay | Admitting: Obstetrics

## 2014-01-05 DIAGNOSIS — Z3689 Encounter for other specified antenatal screening: Secondary | ICD-10-CM

## 2014-01-05 DIAGNOSIS — O99810 Abnormal glucose complicating pregnancy: Secondary | ICD-10-CM | POA: Insufficient documentation

## 2014-01-09 ENCOUNTER — Other Ambulatory Visit (HOSPITAL_COMMUNITY): Payer: Self-pay | Admitting: Obstetrics

## 2014-01-09 ENCOUNTER — Ambulatory Visit (HOSPITAL_COMMUNITY): Payer: Self-pay

## 2014-01-09 ENCOUNTER — Ambulatory Visit (HOSPITAL_COMMUNITY)
Admission: RE | Admit: 2014-01-09 | Discharge: 2014-01-09 | Disposition: A | Discharge status: Home / Self Care | Payer: Medicaid Other | Source: Ambulatory Visit | Attending: Obstetrics | Admitting: Obstetrics

## 2014-01-09 DIAGNOSIS — Z3689 Encounter for other specified antenatal screening: Secondary | ICD-10-CM

## 2014-01-09 DIAGNOSIS — O99810 Abnormal glucose complicating pregnancy: Secondary | ICD-10-CM | POA: Insufficient documentation

## 2014-01-12 ENCOUNTER — Ambulatory Visit (HOSPITAL_COMMUNITY)
Admission: RE | Admit: 2014-01-12 | Discharge: 2014-01-12 | Disposition: A | Discharge status: Home / Self Care | Payer: Medicaid Other | Source: Ambulatory Visit

## 2014-01-12 ENCOUNTER — Other Ambulatory Visit (HOSPITAL_COMMUNITY): Payer: Self-pay | Admitting: Obstetrics

## 2014-01-12 ENCOUNTER — Ambulatory Visit (HOSPITAL_COMMUNITY)
Admission: RE | Admit: 2014-01-12 | Discharge: 2014-01-12 | Disposition: A | Discharge status: Home / Self Care | Payer: Medicaid Other | Source: Ambulatory Visit | Attending: Obstetrics | Admitting: Obstetrics

## 2014-01-12 DIAGNOSIS — O9989 Other specified diseases and conditions complicating pregnancy, childbirth and the puerperium: Secondary | ICD-10-CM

## 2014-01-12 DIAGNOSIS — R7611 Nonspecific reaction to tuberculin skin test without active tuberculosis: Secondary | ICD-10-CM

## 2014-01-12 DIAGNOSIS — O24919 Unspecified diabetes mellitus in pregnancy, unspecified trimester: Secondary | ICD-10-CM

## 2014-01-12 DIAGNOSIS — O99810 Abnormal glucose complicating pregnancy: Secondary | ICD-10-CM | POA: Insufficient documentation

## 2014-01-12 DIAGNOSIS — O99891 Other specified diseases and conditions complicating pregnancy: Secondary | ICD-10-CM | POA: Insufficient documentation

## 2014-01-12 NOTE — Progress Notes (Signed)
MATERNAL FETAL MEDICINE DIABETIC MANAGEMENT APPOINTMENT  Patient Name: Miranda Welch Medical Record Number:  [id number] Date of Birth: 01-06-1981 Requesting Physician Name:  Kathreen CosierBernard A Marshall, MD Date of Service: 01/12/2014  Chief Complaint Gestational Diabetes.  History of Present Illness Miranda Welch was seen today secondary to gestational diabetes at the request of Kathreen CosierBernard A Marshall, MD.  The patient is a 33 y.o. [id number],HYG3P2002,at 2721w4d with an EDD of 02/05/2014, by Last Menstrual Period dating method.  She is currently taking glyburide 2.5 mg po bid.  Review of Systems Pertinent items are noted in HPI.  Patient History OB History  Gravida Para Term Preterm AB SAB TAB Ectopic Multiple Living  3 2 2       2     # Outcome Date GA Lbr Len/2nd Weight Sex Delivery Anes PTL Lv  3 CUR           2 TRM      SVD   Y  1 TRM      SVD   Y      Past Medical History  Diagnosis Date  . Gestational diabetes 2011    No past surgical history on file.  History   Social History  . Marital Status: Married    Spouse Name: N/A    Number of Children: N/A  . Years of Education: N/A   Social History Main Topics  . Smoking status: Never Smoker   . Smokeless tobacco: Not on file  . Alcohol Use: No  . Drug Use: No  . Sexual Activity: Yes   Other Topics Concern  . Not on file   Social History Narrative   [place]oved from [place] 10 yrs ago, lives with husband and 2 daughters (2011, 2004). Stay at home mother. Walks for exercise irregularly.    Family History  Problem Relation Age of Onset  . Diabetes Mother   . Hypertension Mother   . Diabetes Father   . Hypertension Father   . Depression Sister   . Hypertension Sister   . Cancer Maternal Uncle     colon  . Diabetes Maternal Grandmother   . Hypertension Maternal Grandfather    In addition, the patient has no family history of mental retardation, birth defects, or genetic diseases.  Assessment and Recommendations 1.   Gestational diabetes.  Review of her glucose log reveals excellent 2 hour post-prandial values.  However, the majority of her fasting values are over 100.  Thus, I instructed her to take glyburide 5 mg po qhs and 2.5 mg po with breakfast.  Thank you [facility] Ms. Welch [facility].  Please do not hesitate to contact us with questions.   Rema FendtNITSCHE,Farhan Jean, MD

## 2014-01-12 NOTE — ED Notes (Signed)
Refill called in for glyburide 2.5mg  once in am and 5mg  in pm.

## 2014-01-16 ENCOUNTER — Ambulatory Visit (HOSPITAL_COMMUNITY)
Admission: RE | Admit: 2014-01-16 | Discharge: 2014-01-16 | Disposition: A | Discharge status: Home / Self Care | Payer: Medicaid Other | Source: Ambulatory Visit | Attending: Obstetrics | Admitting: Obstetrics

## 2014-01-16 DIAGNOSIS — O24919 Unspecified diabetes mellitus in pregnancy, unspecified trimester: Secondary | ICD-10-CM

## 2014-01-16 DIAGNOSIS — O99810 Abnormal glucose complicating pregnancy: Secondary | ICD-10-CM | POA: Insufficient documentation

## 2014-01-19 ENCOUNTER — Ambulatory Visit (HOSPITAL_COMMUNITY)
Admission: RE | Admit: 2014-01-19 | Discharge: 2014-01-19 | Disposition: A | Discharge status: Home / Self Care | Payer: Medicaid Other | Source: Ambulatory Visit | Attending: Obstetrics | Admitting: Obstetrics

## 2014-01-19 DIAGNOSIS — O99810 Abnormal glucose complicating pregnancy: Secondary | ICD-10-CM | POA: Insufficient documentation

## 2014-01-23 ENCOUNTER — Ambulatory Visit (HOSPITAL_COMMUNITY): Payer: Self-pay

## 2014-01-23 ENCOUNTER — Ambulatory Visit (HOSPITAL_COMMUNITY)
Admission: RE | Admit: 2014-01-23 | Discharge: 2014-01-23 | Disposition: A | Discharge status: Home / Self Care | Payer: Medicaid Other | Source: Ambulatory Visit | Attending: Obstetrics | Admitting: Obstetrics

## 2014-01-23 DIAGNOSIS — Z3689 Encounter for other specified antenatal screening: Secondary | ICD-10-CM

## 2014-01-23 DIAGNOSIS — O99810 Abnormal glucose complicating pregnancy: Secondary | ICD-10-CM | POA: Insufficient documentation

## 2014-01-26 ENCOUNTER — Telehealth (HOSPITAL_COMMUNITY): Payer: Self-pay | Admitting: *Deleted

## 2014-01-26 ENCOUNTER — Ambulatory Visit (HOSPITAL_COMMUNITY)
Admission: RE | Admit: 2014-01-26 | Discharge: 2014-01-26 | Disposition: A | Discharge status: Home / Self Care | Payer: Medicaid Other | Source: Ambulatory Visit | Attending: Obstetrics | Admitting: Obstetrics

## 2014-01-26 ENCOUNTER — Encounter (HOSPITAL_COMMUNITY): Payer: Self-pay | Admitting: *Deleted

## 2014-01-26 DIAGNOSIS — O99810 Abnormal glucose complicating pregnancy: Secondary | ICD-10-CM | POA: Insufficient documentation

## 2014-01-26 LAB — OB RESULTS CONSOLE GBS: GBS: NEGATIVE

## 2014-01-26 NOTE — Telephone Encounter (Signed)
Interpreter [phone]

## 2014-01-26 NOTE — Telephone Encounter (Signed)
Preadmission screen  

## 2014-01-30 ENCOUNTER — Inpatient Hospital Stay (HOSPITAL_COMMUNITY)
Admission: RE | Admit: 2014-01-30 | Discharge: 2014-01-31 | DRG: 775 | Disposition: A | Discharge status: Home / Self Care | Payer: Medicaid Other | Source: Ambulatory Visit | Attending: Obstetrics | Admitting: Obstetrics

## 2014-01-30 ENCOUNTER — Encounter (HOSPITAL_COMMUNITY): Payer: Medicaid Other | Admitting: Anesthesiology

## 2014-01-30 ENCOUNTER — Encounter (HOSPITAL_COMMUNITY): Payer: Self-pay

## 2014-01-30 ENCOUNTER — Inpatient Hospital Stay (HOSPITAL_COMMUNITY): Payer: Medicaid Other | Admitting: Anesthesiology

## 2014-01-30 DIAGNOSIS — D649 Anemia, unspecified: Secondary | ICD-10-CM | POA: Diagnosis present

## 2014-01-30 DIAGNOSIS — O9902 Anemia complicating childbirth: Secondary | ICD-10-CM | POA: Diagnosis present

## 2014-01-30 DIAGNOSIS — O24419 Gestational diabetes mellitus in pregnancy, unspecified control: Secondary | ICD-10-CM | POA: Diagnosis present

## 2014-01-30 DIAGNOSIS — O99814 Abnormal glucose complicating childbirth: Principal | ICD-10-CM | POA: Diagnosis present

## 2014-01-30 LAB — CBC
HCT: 33.2 % — ABNORMAL LOW (ref 36.0–46.0)
HEMATOCRIT: 33.4 % — AB (ref 36.0–46.0)
HEMOGLOBIN: 10.9 g/dL — AB (ref 12.0–15.0)
Hemoglobin: 11.1 g/dL — ABNORMAL LOW (ref 12.0–15.0)
MCH: 26 pg (ref 26.0–34.0)
MCH: 26.2 pg (ref 26.0–34.0)
MCHC: 32.8 g/dL (ref 30.0–36.0)
MCHC: 33.2 g/dL (ref 30.0–36.0)
MCV: 79.2 fL (ref 78.0–100.0)
MCV: 79 fL (ref 78.0–100.0)
PLATELETS: 98 10*3/uL — AB (ref 150–400)
PLATELETS: 105 10*3/uL — AB (ref 150–400)
RBC: 4.19 MIL/uL (ref 3.87–5.11)
RBC: 4.23 MIL/uL (ref 3.87–5.11)
RDW: 16.3 % — ABNORMAL HIGH (ref 11.5–15.5)
RDW: 16.2 % — AB (ref 11.5–15.5)
WBC: 6.6 10*3/uL (ref 4.0–10.5)
WBC: 8.1 10*3/uL (ref 4.0–10.5)

## 2014-01-30 LAB — GLUCOSE, CAPILLARY
GLUCOSE-CAPILLARY: 133 mg/dL — AB (ref 70–99)
Glucose-Capillary: 71 mg/dL (ref 70–99)

## 2014-01-30 LAB — TYPE AND SCREEN
ABO/RH(D): AB POS
ANTIBODY SCREEN: NEGATIVE

## 2014-01-30 LAB — RPR: RPR: NONREACTIVE

## 2014-01-30 LAB — ABO/RH: ABO/RH(D): AB POS

## 2014-01-30 LAB — GLUCOSE, RANDOM: GLUCOSE: 60 mg/dL — AB (ref 70–99)

## 2014-01-30 MED ORDER — WITCH HAZEL-GLYCERIN EX PADS: 1.0000 "application " | MEDICATED_PAD | CUTANEOUS | Status: DC | PRN

## 2014-01-30 MED ORDER — ZOLPIDEM TARTRATE 5 MG PO TABS: 5.0000 mg | ORAL_TABLET | Freq: Every evening | ORAL | Status: DC | PRN

## 2014-01-30 MED ORDER — LIDOCAINE HCL (PF) 1 % IJ SOLN
INTRAMUSCULAR | Status: DC | PRN
  Administered 2014-01-30 (×2): 4 mL

## 2014-01-30 MED ORDER — LACTATED RINGERS IV SOLN
INTRAVENOUS | Status: DC
  Administered 2014-01-30 (×2): 125 mL/h via INTRAVENOUS

## 2014-01-30 MED ORDER — ONDANSETRON HCL 4 MG PO TABS
4.0000 mg | ORAL_TABLET | ORAL | Status: DC | PRN
Start: 2014-01-30 — End: 2014-01-31

## 2014-01-30 MED ORDER — OXYCODONE-ACETAMINOPHEN 5-325 MG PO TABS
1.0000 | ORAL_TABLET | ORAL | Status: DC | PRN
  Administered 2014-01-30: 1 via ORAL
  Filled 2014-01-30: qty 1

## 2014-01-30 MED ORDER — SENNOSIDES-DOCUSATE SODIUM 8.6-50 MG PO TABS
2.0000 | ORAL_TABLET | ORAL | Status: DC
  Administered 2014-01-30 – 2014-01-31 (×2): 2 via ORAL
  Filled 2014-01-30 (×2): qty 2

## 2014-01-30 MED ORDER — ACETAMINOPHEN 325 MG PO TABS: 650.0000 mg | ORAL_TABLET | ORAL | Status: DC | PRN

## 2014-01-30 MED ORDER — PHENYLEPHRINE 40 MCG/ML (10ML) SYRINGE FOR IV PUSH (FOR BLOOD PRESSURE SUPPORT)
80.0000 ug | PREFILLED_SYRINGE | INTRAVENOUS | Status: DC | PRN
  Filled 2014-01-30: qty 10

## 2014-01-30 MED ORDER — BUTORPHANOL TARTRATE 1 MG/ML IJ SOLN: 1.0000 mg | INTRAMUSCULAR | Status: DC | PRN

## 2014-01-30 MED ORDER — LACTATED RINGERS IV SOLN: 500.0000 mL | Freq: Once | INTRAVENOUS | Status: DC

## 2014-01-30 MED ORDER — CITRIC ACID-SODIUM CITRATE 334-500 MG/5ML PO SOLN: 30.0000 mL | ORAL | Status: DC | PRN

## 2014-01-30 MED ORDER — DIPHENHYDRAMINE HCL 25 MG PO CAPS: 25.0000 mg | ORAL_CAPSULE | Freq: Four times a day (QID) | ORAL | Status: DC | PRN

## 2014-01-30 MED ORDER — EPHEDRINE 5 MG/ML INJ: 10.0000 mg | INTRAVENOUS | Status: DC | PRN

## 2014-01-30 MED ORDER — LIDOCAINE HCL (PF) 1 % IJ SOLN
30.0000 mL | INTRAMUSCULAR | Status: DC | PRN
  Filled 2014-01-30: qty 30

## 2014-01-30 MED ORDER — BENZOCAINE-MENTHOL 20-0.5 % EX AERO
1.0000 "application " | INHALATION_SPRAY | CUTANEOUS | Status: DC | PRN
  Administered 2014-01-31: 1 via TOPICAL
  Filled 2014-01-30: qty 56

## 2014-01-30 MED ORDER — OXYTOCIN BOLUS FROM INFUSION: 500.0000 mL | INTRAVENOUS | Status: DC

## 2014-01-30 MED ORDER — IBUPROFEN 600 MG PO TABS
600.0000 mg | ORAL_TABLET | Freq: Four times a day (QID) | ORAL | Status: DC
  Administered 2014-01-30 – 2014-01-31 (×2): 600 mg via ORAL
  Filled 2014-01-30 (×2): qty 1

## 2014-01-30 MED ORDER — LANOLIN HYDROUS EX OINT: TOPICAL_OINTMENT | CUTANEOUS | Status: DC | PRN

## 2014-01-30 MED ORDER — TERBUTALINE SULFATE 1 MG/ML IJ SOLN: 0.2500 mg | Freq: Once | INTRAMUSCULAR | Status: DC | PRN

## 2014-01-30 MED ORDER — PRENATAL MULTIVITAMIN CH
1.0000 | ORAL_TABLET | Freq: Every day | ORAL | Status: DC
  Administered 2014-01-31: 1 via ORAL
  Filled 2014-01-30: qty 1

## 2014-01-30 MED ORDER — DIBUCAINE 1 % RE OINT: 1.0000 "application " | TOPICAL_OINTMENT | RECTAL | Status: DC | PRN

## 2014-01-30 MED ORDER — ONDANSETRON HCL 4 MG/2ML IJ SOLN: 4.0000 mg | INTRAMUSCULAR | Status: DC | PRN

## 2014-01-30 MED ORDER — TETANUS-DIPHTH-ACELL PERTUSSIS 5-2.5-18.5 LF-MCG/0.5 IM SUSP
0.5000 mL | Freq: Once | INTRAMUSCULAR | Status: AC
  Administered 2014-01-31: 0.5 mL via INTRAMUSCULAR
  Filled 2014-01-30: qty 0.5

## 2014-01-30 MED ORDER — DIPHENHYDRAMINE HCL 50 MG/ML IJ SOLN: 12.5000 mg | INTRAMUSCULAR | Status: DC | PRN

## 2014-01-30 MED ORDER — ONDANSETRON HCL 4 MG/2ML IJ SOLN: 4.0000 mg | Freq: Four times a day (QID) | INTRAMUSCULAR | Status: DC | PRN

## 2014-01-30 MED ORDER — OXYCODONE-ACETAMINOPHEN 5-325 MG PO TABS
1.0000 | ORAL_TABLET | ORAL | Status: DC | PRN
  Administered 2014-01-30 – 2014-01-31 (×6): 1 via ORAL
  Filled 2014-01-30 (×6): qty 1

## 2014-01-30 MED ORDER — SIMETHICONE 80 MG PO CHEW: 80.0000 mg | CHEWABLE_TABLET | ORAL | Status: DC | PRN

## 2014-01-30 MED ORDER — IBUPROFEN 600 MG PO TABS
600.0000 mg | ORAL_TABLET | Freq: Four times a day (QID) | ORAL | Status: DC | PRN
  Administered 2014-01-30: 600 mg via ORAL
  Filled 2014-01-30: qty 1

## 2014-01-30 MED ORDER — FENTANYL 2.5 MCG/ML BUPIVACAINE 1/10 % EPIDURAL INFUSION (WH - ANES)
INTRAMUSCULAR | Status: DC | PRN
Start: 2014-01-30 — End: 2014-01-31
  Administered 2014-01-30: 12 mL/h via EPIDURAL

## 2014-01-30 MED ORDER — OXYTOCIN 40 UNITS IN LACTATED RINGERS INFUSION - SIMPLE MED: 62.5000 mL/h | INTRAVENOUS | Status: DC

## 2014-01-30 MED ORDER — LACTATED RINGERS IV SOLN
500.0000 mL | INTRAVENOUS | Status: DC | PRN
  Administered 2014-01-30: 500 mL via INTRAVENOUS

## 2014-01-30 MED ORDER — INFLUENZA VAC SPLIT QUAD 0.5 ML IM SUSP: 0.5000 mL | INTRAMUSCULAR | Status: DC

## 2014-01-30 MED ORDER — OXYTOCIN 40 UNITS IN LACTATED RINGERS INFUSION - SIMPLE MED
1.0000 m[IU]/min | INTRAVENOUS | Status: DC
  Administered 2014-01-30: 2 m[IU]/min via INTRAVENOUS
  Filled 2014-01-30: qty 1000

## 2014-01-30 MED ORDER — FENTANYL 2.5 MCG/ML BUPIVACAINE 1/10 % EPIDURAL INFUSION (WH - ANES)
14.0000 mL/h | INTRAMUSCULAR | Status: DC | PRN
  Filled 2014-01-30: qty 125

## 2014-01-30 MED ORDER — FERROUS SULFATE 325 (65 FE) MG PO TABS
325.0000 mg | ORAL_TABLET | Freq: Two times a day (BID) | ORAL | Status: DC
  Administered 2014-01-30 – 2014-01-31 (×3): 325 mg via ORAL
  Filled 2014-01-30 (×3): qty 1

## 2014-01-30 MED ORDER — EPHEDRINE 5 MG/ML INJ
10.0000 mg | INTRAVENOUS | Status: DC | PRN
  Filled 2014-01-30: qty 4

## 2014-01-30 MED ORDER — PHENYLEPHRINE 40 MCG/ML (10ML) SYRINGE FOR IV PUSH (FOR BLOOD PRESSURE SUPPORT): 80.0000 ug | PREFILLED_SYRINGE | INTRAVENOUS | Status: DC | PRN

## 2014-01-30 NOTE — H&P (Signed)
This is Dr. Francoise CeoBernard Marshall dictating the history and physical on  Miranda Welch she is a 33 year old gravida 3 para 2002 at 7439 weeks and 1 day [facility] negative GBS followed by MFM and estimated fetal weight 8 lbs. 6 oz. And brought in for induction cervix 2 cm 80% vertex -3 she is on low-dose Pitocin amniotomy performed and the fluids clear Past medical history gestational diabetes on glyburide on admission her sugar was 71 Past surgical history negative Social history negative System review negative Physical exam well-developed female in no distress HEENT negative  Lungs clear to P&A Heart regular rhythm no murmurs no gallops Breasts engorged Abdomen term estimated fetal weight 8 lbs. 6 oz. By ultrasound Pelvic as described above Extremities negative

## 2014-01-30 NOTE — Anesthesia Preprocedure Evaluation (Signed)
Anesthesia Evaluation  Patient identified by MRN, date of birth, ID band Patient awake    Reviewed: Allergy & Precautions, H&P , Patient's Chart, lab work & pertinent test results  Airway Mallampati: III TM Distance: >3 FB Neck ROM: Full    Dental no notable dental hx. (+) Teeth Intact   Pulmonary neg pulmonary ROS,  breath sounds clear to auscultation  Pulmonary exam normal       Cardiovascular negative cardio ROS  Rhythm:Regular Rate:Normal     Neuro/Psych negative neurological ROS  negative psych ROS   GI/Hepatic negative GI ROS, Neg liver ROS,   Endo/Other  diabetes, Well Controlled, Gestational, Oral Hypoglycemic AgentsObesity  Renal/GU   negative genitourinary   Musculoskeletal negative musculoskeletal ROS (+)   Abdominal (+) + obese,   Peds  Hematology  (+) anemia , Thrombocytopenia-probably gestational   Anesthesia Other Findings   Reproductive/Obstetrics (+) Pregnancy                           Anesthesia Physical Anesthesia Plan  ASA: II  Anesthesia Plan: Epidural   Post-op Pain Management:    Induction:   Airway Management Planned: Natural Airway  Additional Equipment:   Intra-op Plan:   Post-operative Plan:   Informed Consent: I have reviewed the patients History and Physical, chart, labs and discussed the procedure including the risks, benefits and alternatives for the proposed anesthesia with the patient or authorized representative who has indicated his/her understanding and acceptance.     Plan Discussed with: Anesthesiologist  Anesthesia Plan Comments:         Anesthesia Quick Evaluation

## 2014-01-30 NOTE — Anesthesia Procedure Notes (Signed)
Epidural Patient location during procedure: OB Start time: 01/30/2014 2:27 PM  Staffing Anesthesiologist: Ejay Lashley A.  Preanesthetic Checklist Completed: patient identified, site marked, surgical consent, pre-op evaluation, timeout performed, IV checked, risks and benefits discussed and monitors and equipment checked  Epidural Patient position: sitting Prep: site prepped and draped and DuraPrep Patient monitoring: continuous pulse ox and blood pressure Approach: midline Injection technique: LOR air  Needle:  Needle type: Tuohy  Needle gauge: 17 G Needle length: 9 cm and 9 Needle insertion depth: 5 cm cm Catheter type: closed end flexible Catheter size: 19 Gauge Catheter at skin depth: 10 cm Test dose: negative and Other  Assessment Events: blood not aspirated, injection not painful, no injection resistance, negative IV test and no paresthesia  Additional Notes Patient identified. Risks and benefits discussed including failed block, incomplete  Pain control, post dural puncture headache, nerve damage, paralysis, blood pressure Changes, nausea, vomiting, reactions to medications-both toxic and allergic and post Partum back pain. All questions were answered. Patient expressed understanding and wished to proceed. Sterile technique was used throughout procedure. Epidural site was Dressed with sterile barrier dressing. No paresthesias, signs of intravascular injection Or signs of intrathecal spread were encountered.  Patient was more comfortable after the epidural was dosed. Please see RN's note for documentation of vital signs and FHR which are stable.

## 2014-01-31 LAB — CBC
HEMATOCRIT: 29.3 % — AB (ref 36.0–46.0)
Hemoglobin: 9.6 g/dL — ABNORMAL LOW (ref 12.0–15.0)
MCH: 26.2 pg (ref 26.0–34.0)
MCHC: 32.8 g/dL (ref 30.0–36.0)
MCV: 79.8 fL (ref 78.0–100.0)
Platelets: 89 10*3/uL — ABNORMAL LOW (ref 150–400)
RBC: 3.67 MIL/uL — ABNORMAL LOW (ref 3.87–5.11)
RDW: 17 % — ABNORMAL HIGH (ref 11.5–15.5)
WBC: 8.3 10*3/uL (ref 4.0–10.5)

## 2014-01-31 NOTE — Progress Notes (Signed)
Notified on call Anesthesiologist regarding Platelets 89, still has epidural in her back. Will call back and inform me of instructions.

## 2014-01-31 NOTE — Addendum Note (Signed)
Addendum created 01/31/14 1542 by Algis GreenhouseLinda A Rachel Rison, CRNA   Modules edited: Charges VN, Notes Section   Notes Section:  File: [id number]

## 2014-01-31 NOTE — Progress Notes (Signed)
UR chart review completed.  

## 2014-01-31 NOTE — Anesthesia Postprocedure Evaluation (Signed)
  Anesthesia Post-op Note  Patient: Miranda BridgemanMaria G Welch  Procedure(s) Performed: * No procedures listed *  Patient Location: PACU  Anesthesia Type:Epidural  Level of Consciousness: awake, alert  and oriented  Airway and Oxygen Therapy: Patient Spontanous Breathing  Post-op Pain: mild  Post-op Assessment: Post-op Vital signs reviewed, Patient's Cardiovascular Status Stable, Respiratory Function Stable, Patent Airway, No signs of Nausea or vomiting, Adequate PO intake, Pain level controlled, No headache, No backache, No residual numbness and No residual motor weakness  Post-op Vital Signs: Reviewed and stable  Complications: No apparent anesthesia complications

## 2014-01-31 NOTE — Discharge Instructions (Signed)
Discharge instructions   You can wash your hair  Shower  Eat what you want  Drink what you want  See me in 6 weeks  Your ankles are going to swell more in the next 2 weeks than when pregnant  No sex for 6 weeks   Kayton Ripp A, MD 01/31/2014

## 2014-01-31 NOTE — Progress Notes (Signed)
Dr. Gaynell FaceMarshall notified of platelets and asked about holding Ibuprofen d/t platelets. N/O received.

## 2014-01-31 NOTE — Progress Notes (Signed)
Epidural removed per Dr. Malen GauzeFoster

## 2014-01-31 NOTE — Discharge Summary (Signed)
Obstetric Discharge Summary Reason for Admission: induction of labor Prenatal Procedures: none Intrapartum Procedures: spontaneous vaginal delivery Postpartum Procedures: none Complications-Operative and Postpartum: none Hemoglobin  Date Value Ref Range Status  01/30/2014 11.1* 12.0 - 15.0 g/dL Final     HCT  Date Value Ref Range Status  01/30/2014 33.4* 36.0 - 46.0 % Final    Physical Exam:  General: alert Lochia: appropriate Uterine Fundus: firm Incision: healing well DVT Evaluation: No evidence of DVT seen on physical exam.  Discharge Diagnoses: Term Pregnancy-delivered  Discharge Information: Date: 01/31/2014 Activity: pelvic rest Diet: routine Medications: Percocet Condition: stable Instructions: refer to practice specific booklet Discharge to: home Follow-up Information   Follow up with Kathreen CosierMARSHALL,BERNARD A, MD.   Specialty:  Obstetrics and Gynecology   Contact information:   [address] [place] [address] [phone]       Newborn Data: Live born female  Birth Weight: 8 lb 8.7 oz (3875 g) APGAR: 8, 9  Home with mother.  MARSHALL,BERNARD A 01/31/2014, 6:49 AM

## 2014-01-31 NOTE — Anesthesia Postprocedure Evaluation (Signed)
Anesthesia Post Note  Patient: Miranda BridgemanMaria G Perez-Garcia  Procedure(s) Performed: * No procedures listed *  Anesthesia type: Epidural  Patient location: Mother/Baby  Post pain: Pain level controlled  Post assessment: Post-op Vital signs reviewed  Last Vitals:  Filed Vitals:   01/31/14 0611  BP: 117/57  Pulse: 66  Temp: 36.8 C  Resp: 16    Post vital signs: Reviewed  Level of consciousness:alert  Complications: No apparent anesthesia complications

## 2014-02-04 ENCOUNTER — Inpatient Hospital Stay (HOSPITAL_COMMUNITY): Admission: AD | Admit: 2014-02-04 | Payer: Self-pay | Source: Ambulatory Visit | Admitting: Obstetrics

## 2014-10-15 ENCOUNTER — Encounter (HOSPITAL_COMMUNITY): Payer: Self-pay

## 2017-05-08 ENCOUNTER — Encounter: Payer: Self-pay | Admitting: Family Medicine

## 2017-05-08 ENCOUNTER — Ambulatory Visit (INDEPENDENT_AMBULATORY_CARE_PROVIDER_SITE_OTHER): Payer: Self-pay | Admitting: Family Medicine

## 2017-05-08 VITALS — BP 130/85 | HR 71 | Temp 97.8°F | Resp 18 | Ht 60.0 in | Wt 161.6 lb

## 2017-05-08 DIAGNOSIS — Z124 Encounter for screening for malignant neoplasm of cervix: Secondary | ICD-10-CM

## 2017-05-08 DIAGNOSIS — N898 Other specified noninflammatory disorders of vagina: Secondary | ICD-10-CM

## 2017-05-08 DIAGNOSIS — Z6831 Body mass index (BMI) 31.0-31.9, adult: Secondary | ICD-10-CM

## 2017-05-08 DIAGNOSIS — Z113 Encounter for screening for infections with a predominantly sexual mode of transmission: Secondary | ICD-10-CM

## 2017-05-08 DIAGNOSIS — E6609 Other obesity due to excess calories: Secondary | ICD-10-CM

## 2017-05-08 DIAGNOSIS — R300 Dysuria: Secondary | ICD-10-CM

## 2017-05-08 DIAGNOSIS — Z0000 Encounter for general adult medical examination without abnormal findings: Secondary | ICD-10-CM

## 2017-05-08 DIAGNOSIS — Z833 Family history of diabetes mellitus: Secondary | ICD-10-CM

## 2017-05-08 DIAGNOSIS — N923 Ovulation bleeding: Secondary | ICD-10-CM

## 2017-05-08 LAB — POCT WET + KOH PREP
Trich by wet prep: ABSENT
YEAST BY KOH: ABSENT
Yeast by wet prep: ABSENT

## 2017-05-08 LAB — POCT URINALYSIS DIP (MANUAL ENTRY)
Bilirubin, UA: NEGATIVE
GLUCOSE UA: NEGATIVE mg/dL
Ketones, POC UA: NEGATIVE mg/dL
NITRITE UA: NEGATIVE
Protein Ur, POC: 30 mg/dL — AB
Spec Grav, UA: 1.025 (ref 1.010–1.025)
Urobilinogen, UA: 0.2 E.U./dL
pH, UA: 6.5 (ref 5.0–8.0)

## 2017-05-08 LAB — POC MICROSCOPIC URINALYSIS (UMFC): MUCUS RE: ABSENT

## 2017-05-08 MED ORDER — CLOTRIMAZOLE-BETAMETHASONE 1-0.05 % EX CREA: 1.0000 "application " | TOPICAL_CREAM | Freq: Two times a day (BID) | CUTANEOUS | 0 refills | Status: DC

## 2017-05-08 NOTE — Progress Notes (Signed)
Chief Complaint  Patient presents with  . Gynecologic Exam    wants a pap smear and has been spotting for 2 days and isnt on period and only happens when she wipes   . Dysuria   This is a new patient here to establish care and to get a physical exam.  HPI [employer] Interpreter [phone]  Patient reports that even when she is not on her menstrual cycle she sees vaginal discharge with a red color She states that she gets a burning sensation She denies any pregnancy and her last period was May 15. Patient's last menstrual period was 04/27/2017.  She reports that the spotting started in April  She states that there was a small amount of vaginal discharge with red color  She reports that her last pap smear was a year ago.  Past Medical History:  Diagnosis Date  . Gestational diabetes 2011   oral agent    Current Outpatient Prescriptions  Medication Sig Dispense Refill  . clotrimazole-betamethasone (LOTRISONE) cream Apply 1 application topically 2 (two) times daily. 30 g 0   No current facility-administered medications for this visit.     Allergies: No Known Allergies  Past Surgical History:  Procedure Laterality Date  . NO PAST SURGERIES      Social History   Social History  . Marital status: Significant Other    Spouse name: N/A  . Number of children: N/A  . Years of education: N/A   Social History Main Topics  . Smoking status: Never Smoker  . Smokeless tobacco: Never Used  . Alcohol use No  . Drug use: No  . Sexual activity: Yes   Other Topics Concern  . Not on file   Social History Narrative   [place]oved from [place] 10 yrs ago, lives with husband and 2 daughters (2011, 2004). Stay at home mother. Walks for exercise irregularly.    Review of Systems  Constitutional: Positive for weight loss. Negative for chills and fever.       Intentional weight loss at the gym   Respiratory: Negative for cough and hemoptysis.   Cardiovascular: Negative for chest pain and  palpitations.  Genitourinary: Positive for dysuria. Negative for frequency and urgency.  Neurological: Negative for dizziness and headaches.    Objective: Vitals:   05/08/17 0810  BP: 130/85  Pulse: 71  Resp: 18  Temp: 97.8 F (36.6 C)  TempSrc: Oral  SpO2: 99%  Weight: 161 lb 9.6 oz (73.3 kg)  Height: 5' (1.524 m)  Body mass index is 31.56 kg/m.   Physical Exam  Constitutional: She is oriented to person, place, and time. She appears well-developed and well-nourished.  HENT:  Head: Normocephalic and atraumatic.  Right Ear: External ear normal.  Left Ear: External ear normal.  Nose: Nose normal.  Mouth/Throat: Oropharynx is clear and moist.  Eyes: Conjunctivae and EOM are normal.  Neck: Normal range of motion. No thyromegaly present.  Cardiovascular: Normal rate, regular rhythm and normal heart sounds.   No murmur heard. Pulmonary/Chest: Effort normal and breath sounds normal. No respiratory distress. She has no wheezes.  Musculoskeletal: Normal range of motion. She exhibits no edema.  Neurological: She is alert and oriented to person, place, and time.  Psychiatric: She has a normal mood and affect. Her behavior is normal. Judgment and thought content normal.   Vaginal exam Labia with erythema bilaterally  Urethral meatus normal appearing without erythema Vagina with blood from the cervical os  No CMT, ovaries small and not palpable  Uterus midline, nontender  Assessment and Plan Marnita was seen today for gynecologic exam and dysuria.  Diagnoses and all orders for this visit:  Encounter for health maintenance examination in adult- age appropriate screenings reviewed -     Comprehensive metabolic panel -     Lipid panel  Dysuria- no UTI noted -     POCT urinalysis dipstick -     POCT Microscopic Urinalysis ([facility]) -     Cancel: Urine culture  Vaginal discharge- advised pt to apply lotrisone topically to the vagina externally -     POCT Wet + KOH Prep  Pap  smear for cervical cancer screening- pap result unknown thus will repeat pap -     Pap IG, CT/NG NAA, and HPV (high risk)  Screen for STD (sexually transmitted disease)- will screen for vaginal std -     Pap IG, CT/NG NAA, and HPV (high risk)  Intermenstrual bleeding- discussed that this can be normal and transient but if she has persistence she may need a OCP to regulate her cycle No uterine fibroid palpated on exam -     CBC -     TSH  Family history of diabetes mellitus in first degree relative -     Hemoglobin A1c  Class 1 obesity due to excess calories without serious comorbidity with body mass index (BMI) of 31.0 to 31.9 in adult -     Lipid panel -     Hemoglobin A1c  Other orders -     clotrimazole-betamethasone (LOTRISONE) cream; Apply 1 application topically 2 (two) times daily.     Nicolaos Mitrano A Jovan Colligan

## 2017-05-08 NOTE — Patient Instructions (Addendum)
IF you received an x-ray today, you will receive an invoice from [facility]. Please contact [facility] at [phone] with questions or concerns regarding your invoice.   IF you received labwork today, you will receive an invoice from [place]. Please contact [facility] at [phone] with questions or concerns regarding your invoice.   Our billing staff will not be able to assist you with questions regarding bills from these companies.  You will be contacted with the lab results as soon as they are available. The fastest way to get your results is to activate your My Chart account. Instructions are located on the last page of this paperwork. If you have not heard from [place] regarding the results in 2 weeks, please contact this office.    [place] (Health Maintenance, Female) Un estilo de vida saludable y los cuidados preventivos pueden favorecer considerablemente a la salud y Musician. Pregunte a su mdico cul es el cronograma de exmenes peridicos apropiado para usted. Esta es una buena oportunidad para consultarlo sobre cmo prevenir enfermedades y [place] sano. Adems de los controles, hay muchas otras cosas que puede hacer usted mismo. Los expertos han realizado numerosas investigaciones [employer]itor cambios en el estilo de vida y las medidas de prevencin que, [place], lo ayudarn a mantenerse sano. Solicite a su mdico ms informacin. EL PESO Y LA DIETA Consuma una dieta saludable.  Asegrese de [employer] verduras, frutas, productos lcteos de bajo contenido de [place] y Advertising account planner.  No consuma muchos alimentos de alto contenido de grasas slidas, azcares agregados o sal.  Realice actividad fsica con regularidad. Esta es una de las prcticas ms importantes que puede hacer por su salud.  La mayora de los adultos deben hacer ejercicio durante al menos 131mnutos por semana. El ejercicio debe aumentar la  frecuencia cardaca y pActorla transpiracin (ejercicio de iFirth.  La mayora de los adultos tambin deben hField seismologistejercicios de elongacin al mToysRusveces a la semana. Agregue esto al su plan de ejercicio de intensidad moderada. Mantenga un peso saludable.  El ndice de masa corporal ([facility] es una medida que puede utilizarse para identificar posibles problemas de pCasselberry Proporciona una estimacin de la grasa corporal basndose en el peso y la altura. Su mdico puede ayudarle a dRadiation protection practitionerISt. James Cityy a lScientist, forensico mTheatre managerun peso saludable.  Para las mujeres de 20aos o ms:  Un ISt Cloud Surgical Centermenor de 18,5 se considera bajo peso.  Un IResurrection Medical Centerentre 18,5 y 24,9 es normal.  Un IThe Surgery Center At Orthopedic Associatesentre 25 y 29,9 se considera sobrepeso.  Un IMC de 30 o ms se considera obesidad. Observe los niveles de colesterol y lpidos en la sangre.  Debe comenzar a rEnglish as a second language teacherde lpidos y cResearch officer, trade unionen la sangre a los 20aos y luego repetirlos cada 526aos  Es posible que nAutomotive engineerlos niveles de colesterol con mayor frecuencia si:  Sus niveles de lpidos y colesterol son altos.  Es mayor de 50aos.  Presenta un alto riesgo de padecer enfermedades cardacas. DETECCIN DE CNCER Cncer de pulmn  Se recomienda realizar exmenes de deteccin de cncer de pulmn a personas adultas entre 533y 897aos que estn en riesgo de dHorticulturist, commercialde pulmn por sus antecedentes de consumo de tabaco.  Se recomienda una tomografa computarizada de baja dosis de los pLiberty Mediaaos a las personas que:  Fuman actualmente.  Hayan dejado el hbito en algn momento en los ltimos 15aos.  Hayan fumado durante 30aos un paquete diario. Un paquete-ao equivale a fumar un promedio de un paquete de cigarrillos diario durante un ao.  Los exmenes de deteccin anuales deben continuar hasta que hayan pasado 15aos desde que dej de fumar.  Ya no debern realizarse si tiene un problema de salud que le  impida recibir tratamiento para Science writer de pulmn. Cncer de mama  Practique la autoconciencia de la mama. Esto significa reconocer la apariencia normal de sus mamas y cmo las siente.  Tambin significa realizar autoexmenes regulares de [employer]. Informe a su mdico sobre cualquier cambio, sin importar cun pequeo sea.  Si tiene entre 20 y 47 aos, un mdico debe realizarle un examen clnico de las mamas como parte del examen regular de [place], cada 1 a 3aos.  Si tiene 40aos o ms, debe Information systems manager clnico de las Microsoft. Tambin considere realizarse una Celeste (Joseph) todos los Bier.  Si tiene antecedentes familiares de cncer de mama, hable con su mdico para someterse a un estudio gentico.  Si tiene alto riesgo de Chief Financial Officer de mama, hable con su mdico para someterse a Public house manager y [employer].  La evaluacin del gen del cncer de mama (BRCA) se recomienda a mujeres que tengan familiares con cnceres relacionados con el BRCA. Los cnceres relacionados con el BRCA incluyen los siguientes:  Mama.  Ovario.  Trompas.  Cnceres de peritoneo.  Los resultados de la evaluacin determinarn la necesidad de asesoramiento gentico y de Universal de BRCA1 y BRCA2. Cncer de cuello del tero El mdico puede recomendarle que se haga pruebas peridicas de deteccin de cncer de los rganos de la pelvis (ovarios, tero y vagina). Estas pruebas incluyen un examen plvico, que abarca controlar si se produjeron cambios microscpicos en la superficie del cuello del tero (prueba de Papanicolaou). Pueden recomendarle que se haga estas pruebas cada 3aos, a partir de los 21aos.  A las mujeres que tienen entre 30 y 34aos, los mdicos pueden recomendarles que se sometan a exmenes plvicos y pruebas de Papanicolaou cada 1aos, o a la prueba de Papanicolaou y el examen plvico en combinacin con estudios de  deteccin del virus del papiloma humano (VPH) cada 5aos. Algunos tipos de VPH aumentan el riesgo de Chief Financial Officer de cuello del tero. La prueba para la deteccin del VPH tambin puede realizarse a mujeres de cualquier edad cuyos resultados de la prueba de Papanicolaou no sean claros.  Es posible que otros mdicos no recomienden exmenes de deteccin a mujeres no embarazadas que se consideran sujetos de bajo riesgo de Chief Financial Officer de pelvis y que no tienen sntomas. Pregntele al mdico si un examen plvico de deteccin es adecuado para usted.  Si ha recibido un tratamiento para Science writer cervical o una enfermedad que podra causar cncer, necesitar realizarse una prueba de Papanicolaou y controles durante al menos 7 aos de concluido el [place]. Si no se ha hecho el Papanicolaou con regularidad, debern volver a evaluarse los factores de riesgo (como tener un nuevo compaero sexual), para Teacher, adult education si debe realizarse los estudios nuevamente. Algunas mujeres sufren problemas mdicos que aumentan la probabilidad de Museum/gallery curator cncer de cuello del tero. En estos casos, el mdico podr [employer]COMM se realicen controles y pruebas de Papanicolaou con ms frecuencia. Cncer colorrectal  Este tipo de cncer puede detectarse y a menudo prevenirse.  Por lo general, los estudios de rutina se deben Medical laboratory scientific officer a Field seismologist a Proofreader de  los 50 aos y [employer] 73 aos.  Sin embargo, el mdico podr aconsejarle que lo haga antes, si tiene factores de riesgo para el cncer de colon.  Tambin puede recomendarle que use un kit de prueba para Hydrologist en la materia fecal.  Es posible que se use una pequea cmara en el extremo de un tubo para examinar directamente el colon (sigmoidoscopia o colonoscopia) a fin de Hydrographic surveyor formas tempranas de cncer colorrectal.  Los exmenes de rutina generalmente comienzan a los 39aos.  El examen directo del colon se debe repetir cada 5 a 10aos hasta los  75aos. Sin embargo, es posible que se realicen exmenes con mayor frecuencia, si se detectan formas tempranas de plipos precancerosos o pequeos bultos. Cncer de piel  Revise la piel de la cabeza a los pies con regularidad.  Informe a su mdico si aparecen nuevos lunares o los que tiene se modifican, especialmente en su forma y color.  Tambin notifique al mdico si tiene un lunar que es ms grande que el tamao de una goma de lpiz.  Siempre use pantalla solar. Aplique pantalla solar de Kerry Dory y repetida a lo largo del Training and development officer.  Protjase usando mangas y [employer], un sombrero de ala ancha y gafas para el sol, siempre que se encuentre en el exterior. ENFERMEDADES CARDACAS, DIABETES E HIPERTENSIN ARTERIAL  La hipertensin arterial causa enfermedades cardacas y [place] el riesgo de ictus. La hipertensin arterial es ms probable en los siguientes casos:  Las personas que tienen la presin arterial en el extremo del rango normal (100-139/85-89 mm Hg).  Las personas con sobrepeso u obesidad.  Las Retail banker.  Si usted tiene entre 18 y 39 aos, debe medirse la presin arterial cada 3 a 5 aos. Si usted tiene 40 aos o ms, debe medirse la presin arterial [employer]lett[employer]. Debe medirse la presin arterial dos veces: una vez cuando est en un hospital o una clnica y la otra vez cuando est en otro sitio. Registre el promedio de [employer]. Para controlar su presin arterial cuando no est en un hospital o [place], puede usar lo siguiente:  Ardelia Mems mquina automtica para medir la presin arterial en una farmacia.  Un monitor para medir la presin arterial en el hogar.  Si tiene entre 38 y 75 aos, consulte a su mdico si debe tomar aspirina para prevenir el ictus.  Realcese exmenes de deteccin de la diabetes con regularidad. Esto incluye la toma de [place] de sangre para controlar el nivel de azcar en la sangre durante el [place].  Si tiene un  peso normal y un bajo riesgo de padecer diabetes, realcese este anlisis cada tres aos despus de los 45aos.  Si tiene sobrepeso y un alto riesgo de padecer diabetes, considere someterse a este anlisis antes o con mayor frecuencia. PREVENCIN DE INFECCIONES HepatitisB  Si tiene un riesgo ms alto de Museum/gallery curator hepatitis B, debe someterse a un examen de deteccin de este virus. Se considera que tiene un alto riesgo de Museum/gallery curator hepatitis B si:  Naci en un pas donde la hepatitis B es frecuente. Pregntele a su mdico qu pases son considerados de Public affairs consultant.  Sus padres nacieron en un pas de alto riesgo y usted no recibi una vacuna que lo proteja contra la hepatitis B (vacuna contra la hepatitis B).  [place].  [place] agujas para inyectarse drogas.  Vive con alguien que tiene hepatitis B.  Ha tenido sexo con alguien  que tiene hepatitis B.  Recibe tratamiento de hemodilisis.  Toma ciertos medicamentos para el cncer, trasplante de rganos y afecciones autoinmunitarias. Hepatitis C  Se recomienda un anlisis de [place] para:  Todos los que nacieron entre 1945 y [id number].  Todas las personas que tengan un riesgo de haber contrado hepatitis C. Enfermedades de transmisin sexual (ETS).  Debe realizarse pruebas de deteccin de enfermedades de transmisin sexual (ETS), incluidas gonorrea y clamidia si:  Es sexualmente activo y es menor de 24aos.  Es mayor de 24aos, y Investment banker, operational informa que corre riesgo de tener este tipo de infecciones.  La actividad sexual ha cambiado desde que le hicieron la ltima prueba de deteccin y tiene un riesgo mayor de Best boy clamidia o Radio broadcast assistant. Pregntele al mdico si usted tiene riesgo.  Si no tiene el VIH, pero corre riesgo de infectarse por el virus, se recomienda tomar diariamente un medicamento recetado para evitar la infeccin. Esto se conoce como profilaxis previa a la exposicin. Se considera que est en riesgo si:  Es [place]  sexualmente y no [place] preservativos habitualmente o no conoce el estado del VIH de sus Advertising copywriter.  Se inyecta drogas.  Es [place] sexualmente con Ardelia Mems pareja que tiene VIH. Consulte a su mdico para saber si tiene un alto riesgo de infectarse por el VIH. Si opta por comenzar la profilaxis previa a la exposicin, primero debe realizarse anlisis de deteccin del VIH. Luego, le harn anlisis cada 64mses mientras est tomando los medicamentos para la profilaxis previa a la exposicin. [facility] Si es premenopusica y puede quedar eBolivar solicite a su mdico asesoramiento previo a la concepcin.  Si puede quedar embarazada, tome 400 a [id number](mcg) de cido fAnheuser-Busch  Si desea evitar el embarazo, hable con su mdico sobre el control de la natalidad (anticoncepcin). OSTEOPOROSIS Y MENOPAUSIA  La osteoporosis es una enfermedad en la que los huesos pierden los minerales y la fuerza por el avance de la edad. El resultado pueden ser fracturas graves en los hMalden El riesgo de osteoporosis puede identificarse con uArdelia Memsprueba de densidad sea.  Si tiene 65aos o ms, o si est en riesgo de sufrir osteoporosis y fracturas, pregunte a su mdico si debe someterse a exmenes.  Consulte a su mdico si debe tomar un suplemento de calcio o de vitamina D para reducir el riesgo de osteoporosis.  La menopausia puede presentar ciertos sntomas fsicos y rGaffer  La terapia de reemplazo hormonal puede reducir algunos de estos sntomas y rGaffer Consulte a su mdico para saber si la terapia de reemplazo hormonal es conveniente para usted. INSTRUCCIONES PARA EL CUIDADO EN EL HOGAR  Realcese los estudios de rutina de la salud, dentales y de lPublic librarian  [place]  No consuma ningn producto que contenga tabaco, lo que incluye cigarrillos, tabaco de mHigher education careers advisero cPsychologist, sport and exercise  Si est embarazada, no beba alcohol.  Si est amamantando,  reduzca el consumo de alcohol y la frecuencia con la que consume.  Si es mujer y no est embarazada limite el consumo de alcohol a no ms de 1 medida por da. Una medida equivale a 12onzas de cerveza, 5onzas de vino o 1onzas de bebidas alcohlicas de alta graduacin.  No consuma drogas.  No comparta agujas.  Solicite ayuda a su mdico si necesita apoyo o informacin para abandonar las drogas.  Informe a su mdico si a menudo se siente deprimido.  Notifique a su mdico si alguna vez  ha sido vctima de abuso o si no se siente Scientist, forensic. Esta informacin no tiene Marine scientist el consejo del mdico. Asegrese de hacerle al mdico cualquier pregunta que tenga. Document Released: 11/19/2011 Document Revised: 12/21/2014 Document Reviewed: 09/03/2015 Elsevier Interactive Patient Education  2017 [employer].

## 2017-05-09 LAB — COMPREHENSIVE METABOLIC PANEL
A/G RATIO: 1.7 (ref 1.2–2.2)
ALT: 21 IU/L (ref 0–32)
AST: 20 IU/L (ref 0–40)
Albumin: 4.3 g/dL (ref 3.5–5.5)
Alkaline Phosphatase: 56 IU/L (ref 39–117)
BILIRUBIN TOTAL: 0.4 mg/dL (ref 0.0–1.2)
BUN/Creatinine Ratio: 28 — ABNORMAL HIGH (ref 9–23)
BUN: 22 mg/dL — AB (ref 6–20)
CO2: 23 mmol/L (ref 18–29)
Calcium: 9.3 mg/dL (ref 8.7–10.2)
Chloride: 103 mmol/L (ref 96–106)
Creatinine, Ser: 0.8 mg/dL (ref 0.57–1.00)
GFR calc non Af Amer: 94 mL/min/{1.73_m2} (ref >59)
GFR, EST AFRICAN AMERICAN: 109 mL/min/{1.73_m2} (ref >59)
GLUCOSE: 92 mg/dL (ref 65–99)
Globulin, Total: 2.6 g/dL (ref 1.5–4.5)
POTASSIUM: 4.5 mmol/L (ref 3.5–5.2)
Sodium: 141 mmol/L (ref 134–144)
TOTAL PROTEIN: 6.9 g/dL (ref 6.0–8.5)

## 2017-05-09 LAB — CBC
Hematocrit: 38 % (ref 34.0–46.6)
Hemoglobin: 12.5 g/dL (ref 11.1–15.9)
MCH: 26 pg — AB (ref 26.6–33.0)
MCHC: 32.9 g/dL (ref 31.5–35.7)
MCV: 79 fL (ref 79–97)
PLATELETS: 166 10*3/uL (ref 150–379)
RBC: 4.8 x10E6/uL (ref 3.77–5.28)
RDW: 15.6 % — AB (ref 12.3–15.4)
WBC: 4.8 10*3/uL (ref 3.4–10.8)

## 2017-05-09 LAB — URINE CULTURE

## 2017-05-09 LAB — LIPID PANEL
CHOLESTEROL TOTAL: 151 mg/dL (ref 100–199)
Chol/HDL Ratio: 3.6 ratio (ref 0.0–4.4)
HDL: 42 mg/dL (ref >39)
LDL Calculated: 86 mg/dL (ref 0–99)
Triglycerides: 116 mg/dL (ref 0–149)
VLDL Cholesterol Cal: 23 mg/dL (ref 5–40)

## 2017-05-09 LAB — TSH: TSH: 1.79 u[IU]/mL (ref 0.450–4.500)

## 2017-05-09 LAB — HEMOGLOBIN A1C
ESTIMATED AVERAGE GLUCOSE: 111 mg/dL
Hgb A1c MFr Bld: 5.5 % (ref 4.8–5.6)

## 2017-05-13 LAB — PAP IG, CT-NG NAA, HPV HIGH-RISK
Chlamydia, Nuc. Acid Amp: NEGATIVE
Gonococcus by Nucleic Acid Amp: NEGATIVE
HPV, HIGH-RISK: NEGATIVE
PAP SMEAR COMMENT: 0

## 2019-01-13 ENCOUNTER — Encounter: Payer: Self-pay | Admitting: Internal Medicine

## 2019-01-13 ENCOUNTER — Ambulatory Visit: Payer: Self-pay | Attending: Internal Medicine | Admitting: Internal Medicine

## 2019-01-13 VITALS — BP 126/88 | HR 66 | Temp 98.1°F | Resp 16 | Ht 60.5 in | Wt 172.4 lb

## 2019-01-13 DIAGNOSIS — R030 Elevated blood-pressure reading, without diagnosis of hypertension: Secondary | ICD-10-CM | POA: Insufficient documentation

## 2019-01-13 DIAGNOSIS — Z6833 Body mass index (BMI) 33.0-33.9, adult: Secondary | ICD-10-CM | POA: Insufficient documentation

## 2019-01-13 DIAGNOSIS — R109 Unspecified abdominal pain: Secondary | ICD-10-CM | POA: Insufficient documentation

## 2019-01-13 DIAGNOSIS — R309 Painful micturition, unspecified: Secondary | ICD-10-CM | POA: Insufficient documentation

## 2019-01-13 DIAGNOSIS — R300 Dysuria: Secondary | ICD-10-CM

## 2019-01-13 DIAGNOSIS — E669 Obesity, unspecified: Secondary | ICD-10-CM | POA: Insufficient documentation

## 2019-01-13 DIAGNOSIS — R1031 Right lower quadrant pain: Secondary | ICD-10-CM

## 2019-01-13 LAB — POCT URINALYSIS DIP (CLINITEK)
Bilirubin, UA: NEGATIVE
Glucose, UA: NEGATIVE mg/dL
Ketones, POC UA: NEGATIVE mg/dL
NITRITE UA: NEGATIVE
POC PROTEIN,UA: NEGATIVE
Spec Grav, UA: <=1.005 — AB (ref 1.010–1.025)
UROBILINOGEN UA: 0.2 U/dL
pH, UA: 6.5 (ref 5.0–8.0)

## 2019-01-13 NOTE — Progress Notes (Signed)
Patient ID: Miranda Welch, female    DOB: January 25, 1981  MRN: [id number]  CC: New Patient (Initial Visit)   Subjective: Miranda Welch is a 38 y.o. female who presents for new patient visit.  CAP interpreter, Bernadene Persondgar Jimenez, is with her. Her concerns today include:   Pt does not have any chronic med issues.  No previous PCP.  Pt c/o intermittent  feeling of pressure in lower flank BL x 6 mths.  Episodes usually come on when she is not drinking enough water and goes away when she does.  No relation to physical activity.  There is no radiation.  No numbness or tingling.  She is having an episode now.  Currently there now Endorses a like burning with urination that started today.  Some blood in the urine today but she is starting her period today.  She denies any fever.  She is not taking any pain medication. -She also reports that since November of last year she has noticed occasional intermittent sharp pain in the right lower quadrant of the abdomen and she wonders whether it is her appendix.  No relation to food or bowel movement.  She also wanted to know whether it is normal for her to have a little leakage of old blood from the vagina about 6 days after each menstrual cycle.  She notices it only when she wipes and it lasts only for 1 day.  Menses are regular.  She would like to get her weight down.  She drinks mainly water and tries to stay active.  Family history, social history and surgical histories reviewed. Patient Active Problem List   Diagnosis Date Noted  . Gestational diabetes 01/30/2014  . NVD (normal vaginal delivery) 01/30/2014  . Shoulder pain, right 05/06/2012  . Obesity 05/06/2012  . PALPITATIONS 02/10/2007     Current Outpatient Medications on File Prior to Visit  Medication Sig Dispense Refill  . clotrimazole-betamethasone (LOTRISONE) cream Apply 1 application topically 2 (two) times daily. (Patient not taking: Reported on 01/13/2019) 30 g 0   No current  facility-administered medications on file prior to visit.     No Known Allergies  Social History   Socioeconomic History  . Marital status: Married    Spouse name: Not on file  . Number of children: 3  . Years of education: 6112  . Highest education level: Not on file  Occupational History  . Not on file  Social Needs  . Financial resource strain: Not on file  . Food insecurity:    Worry: Not on file    Inability: Not on file  . Transportation needs:    Medical: Not on file    Non-medical: Not on file  Tobacco Use  . Smoking status: Never Smoker  . Smokeless tobacco: Never Used  Substance and Sexual Activity  . Alcohol use: No  . Drug use: No  . Sexual activity: Yes  Lifestyle  . Physical activity:    Days per week: Not on file    Minutes per session: Not on file  . Stress: Not on file  Relationships  . Social connections:    Talks on phone: Not on file    Gets together: Not on file    Attends religious service: Not on file    Active member of club or organization: Not on file    Attends meetings of clubs or organizations: Not on file    Relationship status: Not on file  . Intimate partner violence:  Fear of current or ex partner: Not on file    Emotionally abused: Not on file    Physically abused: Not on file    Forced sexual activity: Not on file  Other Topics Concern  . Not on file  Social History Narrative   Moved from [place] 10 yrs ago, lives with husband and 2 daughters (2011, 2004). Stay at home mother. Walks for exercise irregularly.    Family History  Problem Relation Age of Onset  . Diabetes Mother   . Hypertension Mother   . Stroke Mother   . Diabetes Father   . Diabetes Sister   . Diabetes Maternal Grandmother   . Diabetes Maternal Grandfather     Past Surgical History:  Procedure Laterality Date  . NO PAST SURGERIES      ROS: Review of Systems Negative except as above PHYSICAL EXAM: BP 126/88   Pulse 66   Temp 98.1 F (36.7 C)  (Oral)   Resp 16   Ht 5' 0.5" (1.537 m)   Wt 172 lb 6.4 oz (78.2 kg)   LMP 12/19/2018   SpO2 100%   BMI 33.12 kg/m   BP 133/84 Physical Exam  General appearance - alert, well appearing, and in no distress Mental status - normal mood, behavior, speech, dress, motor activity, and thought processes Mouth - mucous membranes moist, pharynx normal without lesions Neck - supple, no significant adenopathy Chest - clear to auscultation, no wheezes, rales or rhonchi, symmetric air entry Heart - normal rate, regular rhythm, normal S1, S2, no murmurs, rubs, clicks or gallops Abdomen- nondistended, soft, nontender.  No organomegaly Musculoskeletal -no tenderness on palpation of either flank.  No tenderness on palpation of the thoracic and lumbar spine or surrounding paraspinal muscles. Extremities - peripheral pulses normal, no pedal edema, no clubbing or cyanosis  Results for orders placed or performed in visit on 01/13/19  POCT URINALYSIS DIP (CLINITEK)  Result Value Ref Range   Color, UA yellow yellow   Clarity, UA clear clear   Glucose, UA negative negative mg/dL   Bilirubin, UA negative negative   Ketones, POC UA negative negative mg/dL   Spec Grav, UA <=4.098 (A) 1.010 - 1.025   Blood, UA large (A) negative   pH, UA 6.5 5.0 - 8.0   POC PROTEIN,UA negative negative, trace   Urobilinogen, UA 0.2 0.2 or 1.0 E.U./dL   Nitrite, UA Negative Negative   Leukocytes, UA Trace (A) Negative     ASSESSMENT AND PLAN: 1. Flank pain Since this does not seem to be musculoskeletal and the only initiating factor is not drinking enough water, I recommend that she tries to drink about 4 to 8 glasses of water daily.  She has some blood in the urine but she is currently on her cycle.  We will recheck the urine on her next visit when she is not menstruating. In regards to the little bit of blood that she notices after her menses ends, I think this is old blood working its way out of the vagina from the  previous menstruation and does not need further work-up at this time.  She is up-to-date with Pap smear - POCT URINALYSIS DIP (CLINITEK)  2. Elevated blood pressure reading Went over what his normal blood pressure reading.  DASH diet discussed and encouraged.  3. Class 1 obesity without serious comorbidity with body mass index (BMI) of 33.0 to 33.9 in adult, unspecified obesity type Dietary counseling given.  Patient told to avoid drinking sugary drinks,  cut back on the amount of white carbohydrates consumed, try to eat more white meat than red meat and try to get in at least 150 minutes total of aerobic exercise per week.   Patient was given the opportunity to ask questions.  Patient verbalized understanding of the plan and was able to repeat key elements of the plan.   Orders Placed This Encounter  Procedures  . POCT URINALYSIS DIP (CLINITEK)     Requested Prescriptions    No prescriptions requested or ordered in this encounter    No follow-ups on file.  Jonah Blue, MD, FACP

## 2019-01-13 NOTE — Patient Instructions (Signed)

## 2019-01-13 NOTE — Progress Notes (Signed)
Pt states she has been having some discomfort in her back pt states she think it's her kidneys

## 2019-02-06 ENCOUNTER — Other Ambulatory Visit: Payer: Self-pay | Admitting: Family Medicine

## 2019-02-06 DIAGNOSIS — Z124 Encounter for screening for malignant neoplasm of cervix: Secondary | ICD-10-CM

## 2019-02-06 DIAGNOSIS — N920 Excessive and frequent menstruation with regular cycle: Secondary | ICD-10-CM

## 2019-02-06 NOTE — Progress Notes (Signed)
Patient: Miranda Welch           Date of Birth: June 28, 1981           MRN: [id number] Visit Date: 02/06/2019 PCP: Marcine Matar, MD  Cervical Cancer Screening Do you smoke?: No Have you ever had or been told you have an allergy to latex products?: No Marital status: Married Date of last pap smear: 1-2 yrs ago Date of last menstrual period: 01/19/19 Number of pregnancies: 3 Number of births: 3 Have you ever had any of the following? Hysterectomy: No Tubal ligation (tubes tied): No Abnormal bleeding: Yes Abnormal pap smear: Yes Venereal warts: No A sex partner with venereal warts: No A high risk* sex partner: No  Cervical Exam  Abnormal Observations: none Recommendations: F/u per screening guidelines Has abnormal bleeding, may [facility] to [facility]      Patient's History Patient Active Problem List   Diagnosis Date Noted  . Gestational diabetes 01/30/2014  . NVD (normal vaginal delivery) 01/30/2014  . Shoulder pain, right 05/06/2012  . Obesity 05/06/2012  . PALPITATIONS 02/10/2007   Past Medical History:  Diagnosis Date  . 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 Diabetes Mother   . Hypertension Mother   . Stroke Mother   . Diabetes Father   . Diabetes Sister   . Diabetes Maternal Grandmother   . Diabetes Maternal Grandfather     Past Surgical History:  Procedure Laterality Date  . NO PAST SURGERIES     Social History   Occupational History  . Not on file  Tobacco Use  . Smoking status: Never Smoker  . Smokeless tobacco: Never Used  Substance and Sexual Activity  . Alcohol use: No  . Drug use: No  . Sexual activity: Yes

## 2019-02-06 NOTE — Addendum Note (Signed)
Addended by: Reva Bores on: 02/06/2019 07:39 PM   Modules accepted: Orders

## 2019-02-10 LAB — CYTOLOGY - PAP: Diagnosis: NEGATIVE

## 2019-03-14 ENCOUNTER — Telehealth (HOSPITAL_COMMUNITY): Payer: Self-pay | Admitting: *Deleted

## 2019-03-14 NOTE — Telephone Encounter (Signed)
Called patient with Spanish interpreter Natale Lay from [facility] to discuss her cervical cancer screening results. Explained to patient that her Pap smear was normal. Patient complained of AUB and follow-up was recommended. Patient has no insurance. Offered to refer patient to the [facility] [employer]. Told patient about the [facility] Financial assistance application. Patient would like a referral and verbalized understanding. Referral sent to the [facility] [employer].

## 2019-06-15 ENCOUNTER — Ambulatory Visit (INDEPENDENT_AMBULATORY_CARE_PROVIDER_SITE_OTHER): Payer: Self-pay | Admitting: Obstetrics and Gynecology

## 2019-06-15 ENCOUNTER — Encounter: Payer: Self-pay | Admitting: Obstetrics and Gynecology

## 2019-06-15 DIAGNOSIS — N939 Abnormal uterine and vaginal bleeding, unspecified: Secondary | ICD-10-CM

## 2019-06-15 NOTE — Progress Notes (Signed)
Ms Miranda Welch presents with c/o AUB for the last 2 yrs. She describes episodes of mid cycle spotting, spotting 2 -3 days after cycle completed and at times after heavy lifting. Cycles are regular each month last 5-6 days Sexual active without problems Pap 3/20 normal No contraception No chronic medical problems or medications TSVD x 3  PE AF VSS Lungs clear Heart RRR Abd soft + BS GU nl EGBUS cervix irritation noted, Monsel's applied, uterus small mobile non tender no adnexal masses or tenderness  A/P AUB Discussed with pt. Will check GYN U/S  F/U in 2 months

## 2019-06-27 ENCOUNTER — Ambulatory Visit (HOSPITAL_COMMUNITY)
Admission: RE | Admit: 2019-06-27 | Discharge: 2019-06-27 | Disposition: A | Discharge status: Home / Self Care | Payer: Self-pay | Source: Ambulatory Visit | Attending: Obstetrics and Gynecology | Admitting: Obstetrics and Gynecology

## 2019-06-27 ENCOUNTER — Other Ambulatory Visit: Payer: Self-pay

## 2019-06-27 DIAGNOSIS — N939 Abnormal uterine and vaginal bleeding, unspecified: Secondary | ICD-10-CM | POA: Insufficient documentation

## 2019-07-03 ENCOUNTER — Telehealth: Payer: Self-pay

## 2019-07-03 NOTE — Telephone Encounter (Signed)
-----   Message from Chancy Milroy, MD sent at 07/03/2019 10:45 AM EDT ----- Pt needs appt with me to discuss U/S results and to have a  EMBX Thanks Legrand Como

## 2019-08-07 NOTE — Telephone Encounter (Signed)
Pt has an appt scheduled with Dr. Rip Harbour on 08/17/19 for results and EMBX.

## 2019-08-16 ENCOUNTER — Telehealth: Payer: Self-pay | Admitting: Obstetrics and Gynecology

## 2019-08-16 NOTE — Telephone Encounter (Signed)
Spanish interpreter Miranda Welch called patient about her appointment on 9/3 @ 9:35. Patient instructed to wear a face mask for the entire appointment and no visitors are allowed. Patient screened for covid symptoms and denied having any.

## 2019-08-17 ENCOUNTER — Ambulatory Visit (INDEPENDENT_AMBULATORY_CARE_PROVIDER_SITE_OTHER): Payer: Self-pay | Admitting: Obstetrics and Gynecology

## 2019-08-17 ENCOUNTER — Encounter: Payer: Self-pay | Admitting: Obstetrics and Gynecology

## 2019-08-17 ENCOUNTER — Other Ambulatory Visit: Payer: Self-pay

## 2019-08-17 ENCOUNTER — Other Ambulatory Visit (HOSPITAL_COMMUNITY)
Admission: RE | Admit: 2019-08-17 | Discharge: 2019-08-17 | Disposition: A | Discharge status: Home / Self Care | Payer: Self-pay | Source: Ambulatory Visit | Attending: Obstetrics and Gynecology | Admitting: Obstetrics and Gynecology

## 2019-08-17 VITALS — BP 127/84 | HR 76 | Ht 60.0 in | Wt 169.0 lb

## 2019-08-17 DIAGNOSIS — R9389 Abnormal findings on diagnostic imaging of other specified body structures: Secondary | ICD-10-CM

## 2019-08-17 DIAGNOSIS — N939 Abnormal uterine and vaginal bleeding, unspecified: Secondary | ICD-10-CM

## 2019-08-17 HISTORY — DX: Abnormal findings on diagnostic imaging of other specified body structures: R93.89

## 2019-08-17 LAB — POCT PREGNANCY, URINE: Preg Test, Ur: NEGATIVE

## 2019-08-17 NOTE — Progress Notes (Signed)
ENDOMETRIAL BIOPSY     The indications for endometrial biopsy were reviewed.   Risks of the biopsy including cramping, bleeding, infection, uterine perforation, inadequate specimen and need for additional procedures  were discussed. The patient states she understands and agrees to undergo procedure today. Consent was signed. Time out was performed. Urine HCG was negative. During the pelvic exam, the cervix was prepped with Betadine. A single-toothed tenaculum was placed on the anterior lip of the cervix to stabilize it. The 3 mm pipelle was introduced into the endometrial cavity without difficulty to a depth of 6 cm, and a moderate amount of tissue was obtained and sent to pathology. The instruments were removed from the patient's vagina. Minimal bleeding from the cervix was noted. The patient tolerated the procedure well. Routine post-procedure instructions were given to the patient.

## 2019-08-17 NOTE — Patient Instructions (Signed)
Biopsia de endometrio, cuidados posteriores Endometrial Biopsy, Care After Lea esta informacin sobre cmo cuidarse despus del procedimiento. Su mdico tambin podr darle indicaciones ms especficas. Comunquese con su mdico si tiene problemas o preguntas. Qu puedo esperar despus del procedimiento? Despus del procedimiento, es comn tener los siguientes sntomas:  Calambres leves.  Una pequea cantidad de sangrado vaginal durante unos das. Esto es normal. Siga estas indicaciones en su casa:   Tome los medicamentos de venta libre y los recetados solamente como se lo haya indicado el mdico.  No utilice tampones, duchas vaginales ni tenga relaciones sexuales hasta que el profesional lo autorice.  Retome sus actividades normales como se lo haya indicado el mdico. Pregntele al mdico qu actividades son seguras para usted.  Siga las indicaciones del mdico relacionadas con la restriccin a ciertas actividades, como las restricciones para realizar ejercicios fsicos intensos o levantar objetos pesados. Comunquese con un mdico si:  Tiene un sangrado abundante o sangra durante ms de 2das despus del procedimiento.  Tiene secrecin vaginal con mal olor.  Tiene fiebre o siente escalofros.  Tiene una sensacin de ardor al orinar o tiene dificultad para orinar.  Siente un dolor intenso en la regin inferior del abdomen. Solicite ayuda de inmediato si:  Siente clicos intensos en el estmago o en la espalda.  Elimina cogulos grandes.  La hemorragia aumenta.  Se siente mareada o dbil, o se desmaya. Resumen  Despus del procedimiento, es normal tener clicos leves y tendr una pequea cantidad de sangrado vaginal durante algunos das.  No utilice tampones, duchas vaginales ni tenga relaciones sexuales hasta que el profesional lo autorice.  Retome sus actividades normales como se lo haya indicado el mdico. Pregntele al mdico qu actividades son seguras para usted.  Esta informacin no tiene como fin reemplazar el consejo del mdico. Asegrese de hacerle al mdico cualquier pregunta que tenga. Document Released: 09/20/2013 Document Revised: 07/26/2017 Document Reviewed: 07/26/2017 Elsevier Patient Education  2020 Elsevier Inc.  

## 2019-08-28 ENCOUNTER — Encounter: Payer: Self-pay | Admitting: Obstetrics and Gynecology

## 2019-09-11 ENCOUNTER — Telehealth: Payer: Self-pay | Admitting: Family Medicine

## 2019-09-11 ENCOUNTER — Telehealth: Payer: Self-pay

## 2019-09-11 NOTE — Telephone Encounter (Signed)
Pt called and left message on nurse line VM asking for results from her appt on 9/3.

## 2019-09-11 NOTE — Telephone Encounter (Signed)
Patient's daughter called in stating that her mom is wanting to know here results because no one has called her back yet. Daughter instructed that I would put the message in to the nurses and they will call back as soon as they can. Daughter verbalized understanding. Message forwarded to the clinical pool.

## 2019-09-13 NOTE — Telephone Encounter (Addendum)
I called Miranda Welch with [employer] and gave her results of endometrial biopsy ( negative) . She also wants to know if her already scheduled appointment can be moved up because of her pain. Advised to go to ER if pain severe. Call transferred to registrar to see if can move appt. Gala Padovano,RN

## 2019-10-02 ENCOUNTER — Encounter: Payer: Self-pay | Admitting: Obstetrics and Gynecology

## 2019-10-02 ENCOUNTER — Ambulatory Visit (INDEPENDENT_AMBULATORY_CARE_PROVIDER_SITE_OTHER): Payer: Self-pay | Admitting: Obstetrics and Gynecology

## 2019-10-02 ENCOUNTER — Other Ambulatory Visit: Payer: Self-pay

## 2019-10-02 VITALS — BP 117/77 | HR 69 | Ht 60.0 in | Wt 169.4 lb

## 2019-10-02 DIAGNOSIS — N939 Abnormal uterine and vaginal bleeding, unspecified: Secondary | ICD-10-CM

## 2019-10-02 MED ORDER — DESOGESTREL-ETHINYL ESTRADIOL 0.15-30 MG-MCG PO TABS
1.0000 | ORAL_TABLET | Freq: Every day | ORAL | 11 refills | Status: DC
Start: 2019-10-02 — End: 2020-12-18

## 2019-10-02 NOTE — Progress Notes (Signed)
Miranda Welch presents for lower bd/pelvic pain and AUB.  Pt has had U/S and EMBX completed. Cycles had been monthly since [facility] except for last month when she had 10 days of bleeding after her cycles. Pain appears to associated or worsens with her cycle She denies bowel or bladder dysfunction  PE AF  VSS Lungs clear Heart RRR Abd soft + BS  A/P AUB        Abd/Pelvicpain  Will start OCP's. U/R/B with pt. Will follow up in 3 months

## 2019-10-10 ENCOUNTER — Telehealth: Payer: Self-pay | Admitting: *Deleted

## 2019-10-10 NOTE — Telephone Encounter (Signed)
Received a voicemail message stating have not  been reacting well to the medicine Dr.Ervin requested for her to take. Wants to know what she can do. States she is in pain. Laila Myhre,RN I called [facility] and it was actually her daugher who called for her. I asked to speak with Verdis Frederickson and  [employer] interpreted for me. She reports she started the birth control pill and it made her have nausea- wanted to know if that is normal. I advised her this is normal and it may get better after a few days; if it does not- she can try taking at a different time.  She also c/o upper abdominal pain that started during the night. I explained that is not related to gynecology and we are not primary care. I advised her she can take pepcid otc ; but otherwise to check with her pcp. She reports she does have a pcp and will call them. Navil Kole,RN

## 2019-10-12 ENCOUNTER — Ambulatory Visit: Payer: Self-pay | Admitting: Obstetrics and Gynecology

## 2019-10-18 ENCOUNTER — Ambulatory Visit: Payer: Self-pay

## 2019-10-18 ENCOUNTER — Ambulatory Visit: Payer: Self-pay | Attending: Family Medicine | Admitting: Physician Assistant

## 2019-10-18 ENCOUNTER — Other Ambulatory Visit: Payer: Self-pay

## 2019-10-18 VITALS — BP 120/84 | HR 68 | Temp 97.9°F

## 2019-10-18 DIAGNOSIS — Z789 Other specified health status: Secondary | ICD-10-CM

## 2019-10-18 DIAGNOSIS — R3915 Urgency of urination: Secondary | ICD-10-CM

## 2019-10-18 DIAGNOSIS — R109 Unspecified abdominal pain: Secondary | ICD-10-CM

## 2019-10-18 DIAGNOSIS — Z131 Encounter for screening for diabetes mellitus: Secondary | ICD-10-CM

## 2019-10-18 DIAGNOSIS — N939 Abnormal uterine and vaginal bleeding, unspecified: Secondary | ICD-10-CM

## 2019-10-18 DIAGNOSIS — Z13220 Encounter for screening for lipoid disorders: Secondary | ICD-10-CM

## 2019-10-18 LAB — POCT URINALYSIS DIP (CLINITEK)
Bilirubin, UA: NEGATIVE
Blood, UA: NEGATIVE
Glucose, UA: NEGATIVE mg/dL
Ketones, POC UA: NEGATIVE mg/dL
Leukocytes, UA: NEGATIVE
Nitrite, UA: NEGATIVE
POC PROTEIN,UA: NEGATIVE
Spec Grav, UA: >=1.03 — AB (ref 1.010–1.025)
Urobilinogen, UA: 0.2 E.U./dL
pH, UA: 6.5 (ref 5.0–8.0)

## 2019-10-18 LAB — POCT URINE PREGNANCY: Preg Test, Ur: NEGATIVE

## 2019-10-18 NOTE — Progress Notes (Signed)
Patient ID: Miranda Welch, female   DOB: 1981-09-22, 38 y.o.   MRN: [id number]    Miranda Welch, is a 38 y.o. female  HAL:[id number]  [id number]  DOB - 05-Jan-1981  Subjective:  Chief Complaint and HPI: Miranda Welch is a 38 y.o. female here today to Pain in lower abdomen when she lifts something.  Has sensation of pressure after she urinates.  LMP 10/03/2019.  No dysuria. Last pap about 8 months ago and normal.  +Urinary urgency.  Urinary s/sx about 6 months.  Feels abdomen is inflamed.  All of these symptoms for about 6 months.  Went to gyn about 2 weeks ago and they put her on OCP for endometrial thickening.  Periods normal and regular.  No dysuria.  Wants to be checked for cholesterol and DM.    ROS:   Constitutional:  No f/c, No night sweats, No unexplained weight loss. EENT:  No vision changes, No blurry vision, No hearing changes. No mouth, throat, or ear problems.  Respiratory: No cough, No SOB Cardiac: No CP, no palpitations GI:  + abd pain, No N/V/D/C. GU: No Urinary s/sx Musculoskeletal: No joint pain Neuro: No headache, no dizziness, no motor weakness.  Skin: No rash Endocrine:  No polydipsia. No polyuria.  Psych: Denies SI/HI  No problems updated.  ALLERGIES: No Known Allergies  PAST MEDICAL HISTORY: Past Medical History:  Diagnosis Date  . Gestational diabetes 2011   oral agent  . Thickened endometrium 08/17/2019    MEDICATIONS AT HOME: Prior to Admission medications   Medication Sig Start Date End Date Taking? Authorizing Provider  desogestrel-ethinyl estradiol (APRI) 0.15-30 MG-MCG tablet Take 1 tablet by mouth daily. 10/02/19  Yes Hermina Staggers, MD     Objective:  EXAM:   Vitals:   10/18/19 1349  BP: 120/84  Pulse: 68  Temp: 97.9 F (36.6 C)  TempSrc: Oral    General appearance : A&OX3. NAD. Non-toxic-appearing HEENT: Atraumatic and Normocephalic.  PERRLA. EOM intact.   Chest/Lungs:  Breathing-non-labored, Good air entry  bilaterally, breath sounds normal without rales, rhonchi, or wheezing  CVS: S1 S2 regular, no murmurs, gallops, rubs  Abdomen: Bowel sounds present, Non tender and not distended with no gaurding, rigidity or rebound. No hernia.   Extremities: Bilateral Lower Ext shows no edema, both legs are warm to touch with = pulse throughout Neurology:  CN II-XII grossly intact, Non focal.   Psych:  TP linear. J/I WNL. Normal speech. Appropriate eye contact and affect.  Skin:  No Rash  Data Review Lab Results  Component Value Date   HGBA1C 5.5 05/08/2017     Assessment & Plan   1. Abdominal pain, unspecified abdominal location Reviewed U/S which showed endometrial thickening which may be causing issue and she hasn't been on OCP long enough to take effect.  Non-acute abdomen - POCT URINALYSIS DIP (CLINITEK) - POCT urine pregnancy - Comprehensive metabolic panel  2. Abnormal vaginal bleeding Worked up by gyn last month - TSH - CBC with Differential/Platelet - Comprehensive metabolic panel  3. Urinary urgency UA ok today w/o infection.   Consider urology referral if continues  4. Language barrier stratus interpreters used and additional time performing visit was required.   5. Screening, lipid - Lipid panel  6. Screening for diabetes mellitus - Hemoglobin A1c   See PCP 3 months;  Sooner if needed  Patient have been counseled extensively about nutrition and exercise    The patient was given clear instructions to go to ER  or return to medical center if symptoms don't improve, worsen or new problems develop. The patient verbalized understanding. The patient was told to call to get lab results if they haven't heard anything in the next week.     Freeman Caldron, PA-C [facility] [place], [place]   10/18/2019, 2:27 PMhickening.

## 2019-10-19 LAB — COMPREHENSIVE METABOLIC PANEL
ALT: 13 IU/L (ref 0–32)
AST: 15 IU/L (ref 0–40)
Albumin/Globulin Ratio: 1.4 (ref 1.2–2.2)
Albumin: 4.1 g/dL (ref 3.8–4.8)
Alkaline Phosphatase: 66 IU/L (ref 39–117)
BUN/Creatinine Ratio: 25 — ABNORMAL HIGH (ref 9–23)
BUN: 17 mg/dL (ref 6–20)
Bilirubin Total: <0.2 mg/dL (ref 0.0–1.2)
CO2: 22 mmol/L (ref 20–29)
Calcium: 9.7 mg/dL (ref 8.7–10.2)
Chloride: 102 mmol/L (ref 96–106)
Creatinine, Ser: 0.69 mg/dL (ref 0.57–1.00)
GFR calc Af Amer: 127 mL/min/{1.73_m2} (ref >59)
GFR calc non Af Amer: 110 mL/min/{1.73_m2} (ref >59)
Globulin, Total: 3 g/dL (ref 1.5–4.5)
Glucose: 101 mg/dL — ABNORMAL HIGH (ref 65–99)
Potassium: 4.3 mmol/L (ref 3.5–5.2)
Sodium: 138 mmol/L (ref 134–144)
Total Protein: 7.1 g/dL (ref 6.0–8.5)

## 2019-10-19 LAB — CBC WITH DIFFERENTIAL/PLATELET
Basophils Absolute: 0 10*3/uL (ref 0.0–0.2)
Basos: 0 %
EOS (ABSOLUTE): 0.1 10*3/uL (ref 0.0–0.4)
Eos: 1 %
Hematocrit: 34.5 % (ref 34.0–46.6)
Hemoglobin: 11 g/dL — ABNORMAL LOW (ref 11.1–15.9)
Immature Grans (Abs): 0 10*3/uL (ref 0.0–0.1)
Immature Granulocytes: 0 %
Lymphocytes Absolute: 2.9 10*3/uL (ref 0.7–3.1)
Lymphs: 39 %
MCH: 23.9 pg — ABNORMAL LOW (ref 26.6–33.0)
MCHC: 31.9 g/dL (ref 31.5–35.7)
MCV: 75 fL — ABNORMAL LOW (ref 79–97)
Monocytes Absolute: 0.4 10*3/uL (ref 0.1–0.9)
Monocytes: 5 %
Neutrophils Absolute: 4.1 10*3/uL (ref 1.4–7.0)
Neutrophils: 55 %
Platelets: 223 10*3/uL (ref 150–450)
RBC: 4.61 x10E6/uL (ref 3.77–5.28)
RDW: 17.1 % — ABNORMAL HIGH (ref 11.7–15.4)
WBC: 7.5 10*3/uL (ref 3.4–10.8)

## 2019-10-19 LAB — LIPID PANEL
Chol/HDL Ratio: 3.8 ratio (ref 0.0–4.4)
Cholesterol, Total: 162 mg/dL (ref 100–199)
HDL: 43 mg/dL (ref >39)
LDL Chol Calc (NIH): 75 mg/dL (ref 0–99)
Triglycerides: 268 mg/dL — ABNORMAL HIGH (ref 0–149)
VLDL Cholesterol Cal: 44 mg/dL — ABNORMAL HIGH (ref 5–40)

## 2019-10-19 LAB — HEMOGLOBIN A1C
Est. average glucose Bld gHb Est-mCnc: 114 mg/dL
Hgb A1c MFr Bld: 5.6 % (ref 4.8–5.6)

## 2019-10-19 LAB — TSH: TSH: 2.42 u[IU]/mL (ref 0.450–4.500)

## 2019-12-20 ENCOUNTER — Other Ambulatory Visit: Payer: Self-pay | Admitting: Cardiology

## 2019-12-20 DIAGNOSIS — Z20822 Contact with and (suspected) exposure to covid-19: Secondary | ICD-10-CM

## 2019-12-22 LAB — NOVEL CORONAVIRUS, NAA: SARS-CoV-2, NAA: NOT DETECTED

## 2019-12-27 ENCOUNTER — Ambulatory Visit: Payer: Self-pay | Attending: Internal Medicine | Admitting: Physician Assistant

## 2019-12-27 ENCOUNTER — Other Ambulatory Visit: Payer: Self-pay

## 2019-12-27 DIAGNOSIS — R1011 Right upper quadrant pain: Secondary | ICD-10-CM

## 2019-12-27 MED ORDER — OMEPRAZOLE 20 MG PO CPDR: 20.0000 mg | DELAYED_RELEASE_CAPSULE | Freq: Every day | ORAL | 0 refills | Status: DC

## 2019-12-27 MED ORDER — NAPROXEN 500 MG PO TABS: 500.0000 mg | ORAL_TABLET | Freq: Two times a day (BID) | ORAL | 0 refills | Status: DC

## 2019-12-27 MED FILL — OMEPRAZOLE 20 MG CAP: 20 | 30 days supply | Qty: 30 | Fill #0

## 2019-12-27 MED FILL — NAPROXEN 500 MG TABLET: 500 | 30 days supply | Qty: 60 | Fill #0

## 2019-12-27 NOTE — Progress Notes (Signed)
Pt. Stated she is having pain under her right breast, location 4 fingers down. Pain for 3 weeks, feel like running and air is in there.

## 2019-12-27 NOTE — Progress Notes (Signed)
Virtual Visit via Telephone Note  I connected with Miranda Welch on 12/27/19 at  8:50 AM EST by telephone and verified that I am speaking with the correct person using two identifiers.   I discussed the limitations, risks, security and privacy concerns of performing an evaluation and management service by telephone and the availability of in person appointments. I also discussed with the patient that there may be a patient responsible charge related to this service. The patient expressed understanding and agreed to proceed.  PATIENT visit by telephone virtually in the context of Covid-19 pandemic. Patient location:  home My Location:  home office/remotely Persons on the call:  Me, the patient, and interpreter    History of Present Illness:  Pain beneath R breast/top of R abdomen.  Occurring for 3 weeks.  LMP:  Now. Pain is mostly constant but does come and go.  No rash.  No redness.  No swelling.  No rash.   Appetite is good.  No weight loss.  No urinary s/sx.  Has not taken anything for pain. No fever. Pain is superficial/closer to skin.  No vomiting or nausea.  No change in BM    Observations/Objective:  NAD.  A&Ox3  Assessment and Plan: 1. RUQ pain Try naprosyn Rx for pain.   - Comprehensive metabolic panel - CBC with Differential/Platelet; Future - H. pylori breath test    Follow Up Instructions: See PCP 1-2 months   I discussed the assessment and treatment plan with the patient. The patient was provided an opportunity to ask questions and all were answered. The patient agreed with the plan and demonstrated an understanding of the instructions.   The patient was advised to call back or seek an in-person evaluation if the symptoms worsen or if the condition fails to improve as anticipated.  I provided 14 minutes of non-face-to-face time during this encounter.   Georgian Co, PA-C  Patient ID: Miranda Welch, female   DOB: [birth year], 39 y.o.   MRN: [id number]

## 2019-12-28 ENCOUNTER — Other Ambulatory Visit: Payer: Self-pay

## 2019-12-28 ENCOUNTER — Ambulatory Visit: Payer: Self-pay | Attending: Internal Medicine

## 2019-12-29 LAB — COMPREHENSIVE METABOLIC PANEL
ALT: 11 IU/L (ref 0–32)
AST: 15 IU/L (ref 0–40)
Albumin/Globulin Ratio: 1.4 (ref 1.2–2.2)
Albumin: 4.1 g/dL (ref 3.8–4.8)
Alkaline Phosphatase: 53 IU/L (ref 39–117)
BUN/Creatinine Ratio: 21 (ref 9–23)
BUN: 17 mg/dL (ref 6–20)
Bilirubin Total: <0.2 mg/dL (ref 0.0–1.2)
CO2: 20 mmol/L (ref 20–29)
Calcium: 9.2 mg/dL (ref 8.7–10.2)
Chloride: 103 mmol/L (ref 96–106)
Creatinine, Ser: 0.8 mg/dL (ref 0.57–1.00)
GFR calc Af Amer: 107 mL/min/{1.73_m2} (ref >59)
GFR calc non Af Amer: 93 mL/min/{1.73_m2} (ref >59)
Globulin, Total: 3 g/dL (ref 1.5–4.5)
Glucose: 83 mg/dL (ref 65–99)
Potassium: 4.8 mmol/L (ref 3.5–5.2)
Sodium: 138 mmol/L (ref 134–144)
Total Protein: 7.1 g/dL (ref 6.0–8.5)

## 2019-12-29 LAB — CBC WITH DIFFERENTIAL/PLATELET
Basophils Absolute: 0 10*3/uL (ref 0.0–0.2)
Basos: 0 %
EOS (ABSOLUTE): 0.1 10*3/uL (ref 0.0–0.4)
Eos: 2 %
Hematocrit: 36.8 % (ref 34.0–46.6)
Hemoglobin: 11.2 g/dL (ref 11.1–15.9)
Immature Grans (Abs): 0 10*3/uL (ref 0.0–0.1)
Immature Granulocytes: 0 %
Lymphocytes Absolute: 2.5 10*3/uL (ref 0.7–3.1)
Lymphs: 37 %
MCH: 22.5 pg — ABNORMAL LOW (ref 26.6–33.0)
MCHC: 30.4 g/dL — ABNORMAL LOW (ref 31.5–35.7)
MCV: 74 fL — ABNORMAL LOW (ref 79–97)
Monocytes Absolute: 0.2 10*3/uL (ref 0.1–0.9)
Monocytes: 3 %
Neutrophils Absolute: 3.9 10*3/uL (ref 1.4–7.0)
Neutrophils: 58 %
Platelets: 286 10*3/uL (ref 150–450)
RBC: 4.98 x10E6/uL (ref 3.77–5.28)
RDW: 15.5 % — ABNORMAL HIGH (ref 11.7–15.4)
WBC: 6.8 10*3/uL (ref 3.4–10.8)

## 2019-12-30 LAB — H. PYLORI BREATH TEST: H pylori Breath Test: NEGATIVE

## 2020-01-01 ENCOUNTER — Telehealth: Payer: Self-pay | Admitting: *Deleted

## 2020-01-01 NOTE — Telephone Encounter (Signed)
Medical Assistant used [employer] to contact patient.  Interpreter Name: Hollie Salk Interpreter #: [id number] Patient was not available, [employer] left patient a voicemail. Patient is aware of Blood count being stable and to follow up as planned.

## 2020-01-01 NOTE — Telephone Encounter (Signed)
-----   Message from Anders Simmonds, [place] sent at 12/29/2019  1:38 PM EST ----- Please call patient.  Blood count is stable.  Follow-up as planned.  Thanks, Georgian Co, PA-C

## 2020-01-01 NOTE — Progress Notes (Signed)
Patient is aware of all normal labs and no bacteria to cause ulcers.

## 2020-01-05 ENCOUNTER — Telehealth: Payer: Self-pay | Admitting: Internal Medicine

## 2020-01-05 NOTE — Telephone Encounter (Signed)
Please see note from CMA from 01/01/2020. Labs are stable, stomach ulcer test is negative.

## 2020-01-05 NOTE — Telephone Encounter (Signed)
Patient is calling requesting her lab results. Please f/u

## 2020-01-05 NOTE — Telephone Encounter (Signed)
Called patient as per Dr Alvis Lemmings instructions. Results related to patient Labs stable. Stomach ulcer test negative/Verbalized understanding/ All questions answered.

## 2020-01-18 ENCOUNTER — Other Ambulatory Visit (HOSPITAL_COMMUNITY): Payer: Self-pay

## 2020-01-18 DIAGNOSIS — Z1231 Encounter for screening mammogram for malignant neoplasm of breast: Secondary | ICD-10-CM

## 2020-01-19 ENCOUNTER — Telehealth (INDEPENDENT_AMBULATORY_CARE_PROVIDER_SITE_OTHER): Payer: Self-pay | Admitting: Obstetrics and Gynecology

## 2020-01-19 ENCOUNTER — Encounter: Payer: Self-pay | Admitting: Obstetrics and Gynecology

## 2020-01-19 ENCOUNTER — Other Ambulatory Visit: Payer: Self-pay

## 2020-01-19 DIAGNOSIS — N939 Abnormal uterine and vaginal bleeding, unspecified: Secondary | ICD-10-CM

## 2020-01-19 NOTE — Progress Notes (Signed)
Patient ID: Miranda Welch, female   DOB: [birth year], 39 y.o.   MRN: [id number]    TELEHEALTH VIRTUAL GYNECOLOGY VISIT ENCOUNTER NOTE  I connected with Leatrice Parilla Perez-Garcia on 01/19/20 at  9:55 AM EST by telephone at home and verified that I am speaking with the correct person using two identifiers.   I discussed the limitations, risks, security and privacy concerns of performing an evaluation and management service by telephone and the availability of in person appointments. I also discussed with the patient that there may be a patient responsible charge related to this service. The patient expressed understanding and agreed to proceed.   History:  Miranda Welch is a 39 y.o. [id number] female being evaluated today for follow up of AUB. Started on OCP's last October. She reports that her Sx have improved. Cycles are regular, lighter and not as painful. Tolerating OCP's well.     Past Medical History:  Diagnosis Date  . Gestational diabetes 2011   oral agent  . Thickened endometrium 08/17/2019   Past Surgical History:  Procedure Laterality Date  . NO PAST SURGERIES     The following portions of the patient's history were reviewed and updated as appropriate: allergies, current medications, past family history, past medical history, past social history, past surgical history and problem list.   Health Maintenance:  Normal pap and negative HRHPV on 2/20.     Review of Systems:  Pertinent items noted in HPI and remainder of comprehensive ROS otherwise negative.  Physical Exam:   General:  Alert, oriented and cooperative.   Mental Status: Normal mood and affect perceived. Normal judgment and thought content.  Physical exam deferred due to nature of the encounter  Labs and Imaging No results found for this or any previous visit (from the past 336 hour(s)). No results found.    Assessment and Plan:     1. Abnormal uterine bleeding (AUB) Stable Will continue with Apri and  follow up in October.  Interrupter used during today's visit       I discussed the assessment and treatment plan with the patient. The patient was provided an opportunity to ask questions and all were answered. The patient agreed with the plan and demonstrated an understanding of the instructions.   The patient was advised to call back or seek an in-person evaluation/go to the ED if the symptoms worsen or if the condition fails to improve as anticipated.  I provided 10 minutes of non-face-to-face time during this encounter.   Hermina Staggers, MD [facility] for [facility], [facility]

## 2020-01-19 NOTE — Progress Notes (Signed)
10:10a- Called Pt for My Chart visit using [employer] id# [phone] from [facility]. Pt did not answer, left VM that will call back in 10 to 15 minutes.    10:40a-  I connected with  RAVONDA BRECHEEN on 01/19/20 at  9:55 AM EST by telephone and verified that I am speaking with the correct person using two identifiers.   I discussed the limitations, risks, security and privacy concerns of performing an evaluation and management service by telephone and the availability of in person appointments. I also discussed with the patient that there may be a patient responsible charge related to this service. The patient expressed understanding and agreed to proceed.  Henrietta Dine, CMA 01/19/2020  10:41 AM

## 2020-02-15 ENCOUNTER — Other Ambulatory Visit: Payer: Self-pay

## 2020-02-15 ENCOUNTER — Ambulatory Visit
Admission: RE | Admit: 2020-02-15 | Discharge: 2020-02-15 | Disposition: A | Discharge status: Home / Self Care | Payer: No Typology Code available for payment source | Source: Ambulatory Visit | Attending: Obstetrics and Gynecology | Admitting: Obstetrics and Gynecology

## 2020-02-15 ENCOUNTER — Ambulatory Visit: Payer: No Typology Code available for payment source | Admitting: Student

## 2020-02-15 VITALS — BP 119/82 | Temp 97.8°F | Wt 168.0 lb

## 2020-02-15 DIAGNOSIS — Z1231 Encounter for screening mammogram for malignant neoplasm of breast: Secondary | ICD-10-CM

## 2020-02-15 DIAGNOSIS — Z1239 Encounter for other screening for malignant neoplasm of breast: Secondary | ICD-10-CM

## 2020-02-15 NOTE — Progress Notes (Signed)
[name] is a 39 y.o. female who presents to [facility] clinic today with no complaints.    Pap Smear: Pap not smear completed today. Last Pap smear was 02/06/2019 at [facility]. And was normal. Per patient has no history of an abnormal Pap smear. Last Pap smear result is available in Epic.   Physical exam: Breasts Breasts symmetrical. No skin abnormalities bilateral breasts. No nipple retraction bilateral breasts. No nipple discharge bilateral breasts. No lymphadenopathy. No lumps palpated bilateral breasts.       Pelvic/Bimanual Pap is not indicated today    Smoking History: Patient has never smoked.  Patient Navigation: Patient education provided. Access to services provided for patient through [employer] program. Jamesetta Orleans interpreter provided. No transportation provided   Colorectal Cancer Screening: Per patient has never had colonoscopy completed No complaints today.    Breast and Cervical Cancer Risk Assessment: Patient has family history of breast cancer, known genetic mutations, or radiation treatment to the chest before age 86. Patient does not have history of cervical dysplasia, immunocompromised, or DES exposure in-utero.  Risk Assessment    Risk Scores      02/15/2020   Last edited by: Narda Rutherford, LPN   5-year risk: 0.3 %   Lifetime risk: 6.4 %          A: BCCCP exam without pap smear Complaint of nothing  P: Referred patient to the [facility] for a diagnostic mammogram. Appointment scheduled this afternoon (02/15/2020).  Marylene Land, CNM 02/15/2020 2:34 PM

## 2020-06-12 ENCOUNTER — Encounter: Payer: Self-pay | Admitting: Physician Assistant

## 2020-06-12 ENCOUNTER — Ambulatory Visit: Payer: Self-pay | Attending: Physician Assistant | Admitting: Physician Assistant

## 2020-06-12 DIAGNOSIS — R202 Paresthesia of skin: Secondary | ICD-10-CM

## 2020-06-12 DIAGNOSIS — Z131 Encounter for screening for diabetes mellitus: Secondary | ICD-10-CM

## 2020-06-12 DIAGNOSIS — G5603 Carpal tunnel syndrome, bilateral upper limbs: Secondary | ICD-10-CM

## 2020-06-12 NOTE — Progress Notes (Signed)
C /o numbness and burning sensation  on her extremities particularly at night

## 2020-06-12 NOTE — Progress Notes (Signed)
Virtual Visit via Telephone Note  I connected with Miranda Welch on 06/12/20 at  1:50 PM EDT by telephone and verified that I am speaking with the correct person using two identifiers.   I discussed the limitations, risks, security and privacy concerns of performing an evaluation and management service by telephone and the availability of in person appointments. I also discussed with the patient that there may be a patient responsible charge related to this service. The patient expressed understanding and agreed to proceed.  PATIENT visit by telephone virtually in the context of Covid-19 pandemic. Patient location:  home My Location:  [facility] office Persons on the call:  Me and the nurse interpreting Miranda Welch)and patient  History of Present Illness:  C/o hand tingling and burning esp at night for the last few months.  Also c/o legs being jumpy at times.  NKI.  Last labs in January and unremarkable.     Observations/Objective:  NAD.  A&Ox3   Assessment and Plan: 1. Paresthesia - Ambulatory referral to Hand Surgery - TSH - Vitamin D, 25-hydroxy  2. Bilateral carpal tunnel syndrome - Ambulatory referral to Hand Surgery  3. Screening for diabetes mellitus - Hemoglobin A1c    Follow Up Instructions: See PCP in 3 months   I discussed the assessment and treatment plan with the patient. The patient was provided an opportunity to ask questions and all were answered. The patient agreed with the plan and demonstrated an understanding of the instructions.   The patient was advised to call back or seek an in-person evaluation if the symptoms worsen or if the condition fails to improve as anticipated.  I provided 12 minutes of non-face-to-face time during this encounter.   Georgian Co, PA-C  Patient ID: Miranda Welch, female   DOB: Sep 28, 1981, 39 y.o.   MRN: [id number]

## 2020-06-13 ENCOUNTER — Other Ambulatory Visit: Payer: Self-pay | Admitting: Physician Assistant

## 2020-06-13 LAB — HEMOGLOBIN A1C
Est. average glucose Bld gHb Est-mCnc: 117 mg/dL
Hgb A1c MFr Bld: 5.7 % — ABNORMAL HIGH (ref 4.8–5.6)

## 2020-06-13 LAB — VITAMIN D 25 HYDROXY (VIT D DEFICIENCY, FRACTURES): Vit D, 25-Hydroxy: 22.6 ng/mL — ABNORMAL LOW (ref 30.0–100.0)

## 2020-06-13 LAB — TSH: TSH: 1.5 u[IU]/mL (ref 0.450–4.500)

## 2020-06-13 MED ORDER — VITAMIN D (ERGOCALCIFEROL) 1.25 MG (50000 UNIT) PO CAPS: 50000.0000 [IU] | ORAL_CAPSULE | ORAL | 0 refills | Status: DC

## 2020-07-23 ENCOUNTER — Ambulatory Visit (INDEPENDENT_AMBULATORY_CARE_PROVIDER_SITE_OTHER): Payer: Self-pay | Admitting: Orthopaedic Surgery

## 2020-07-23 ENCOUNTER — Encounter: Payer: Self-pay | Admitting: Orthopaedic Surgery

## 2020-07-23 VITALS — Ht 61.5 in | Wt 172.8 lb

## 2020-07-23 DIAGNOSIS — R202 Paresthesia of skin: Secondary | ICD-10-CM

## 2020-07-23 NOTE — Progress Notes (Signed)
Office Visit Note   Patient: Miranda Welch           Date of Birth: 10/27/1981           MRN: [id number] Visit Date: 07/23/2020              Requested by: Anders Simmonds, PA-C [address] [place],  [place] [address] PCP: Marcine Matar, MD   Assessment & Plan: Visit Diagnoses:  1. Paresthesias in left hand   2. Paresthesias in right hand     Plan: Impression is mild early carpal tunnel syndrome.  We have referred the patient to Dr. Alvester Morin for bilateral upper extremity nerve conduction studies.  She will follow up with [place] once those been completed.  Follow-Up Instructions: Return for after NCS/EMG.   Orders:  No orders of the defined types were placed in this encounter.  No orders of the defined types were placed in this encounter.     Procedures: No procedures performed   Clinical Data: No additional findings.   Subjective: Chief Complaint  Patient presents with  . Right Hand - Pain  . Left Hand - Pain    HPI patient is a very pleasant 39 year old right-hand-dominant Spanish-speaking female who comes in today with bilateral hand numbness and tingling.  She noticed this approximately 2-1/2 months ago without any known injury or change in activity.  She does not do any sort of repetitive movements for work.  She does note that when she is holding something for a long period of time in a certain position her symptoms seem to worsen.  The symptoms she has are primarily through the median nerve distribution but also occur occasionally to the small finger.  She was seen by her primary care provider recently where it was noted that she had very low vitamin D.  She was put on vitamin D supplementation which she notes has seemed to somewhat help her symptoms.  Review of Systems as detailed in HPI.  All others reviewed and are negative.   Objective: Vital Signs: Ht 5' 1.5" (1.562 m)   Wt 172 lb 12.8 oz (78.4 kg)   BMI 32.12 kg/m   Physical Exam  well-developed well-nourished female in no acute distress.  Alert and oriented x3.  Ortho Exam examination of both hands reveal decreased sensation to the fingertips to the median nerve distribution left greater than right.  Negative Phalen both sides.  Negative Tinel at the wrist both sides.  Positive Tinel at the right elbow.  No thenar atrophy.  Full grip strength.  Specialty Comments:  No specialty comments available.  Imaging: No new imaging   PMFS History: Patient Active Problem List   Diagnosis Date Noted  . Abnormal uterine bleeding (AUB) 06/15/2019  . Shoulder pain, right 05/06/2012  . Obesity 05/06/2012  . PALPITATIONS 02/10/2007   Past Medical History:  Diagnosis Date  . Gestational diabetes 2011   oral agent  . Thickened endometrium 08/17/2019    Family History  Problem Relation Age of Onset  . Diabetes Mother   . Hypertension Mother   . Stroke Mother   . Diabetes Father   . Diabetes Sister   . Diabetes Maternal Grandmother   . Diabetes Maternal Grandfather     Past Surgical History:  Procedure Laterality Date  . NO PAST SURGERIES     Social History   Occupational History  . Not on file  Tobacco Use  . Smoking status: Never Smoker  . Smokeless  tobacco: Never Used  Vaping Use  . Vaping Use: Never used  Substance and Sexual Activity  . Alcohol use: No  . Drug use: No  . Sexual activity: Yes    Birth control/protection: Pill

## 2020-07-24 ENCOUNTER — Other Ambulatory Visit: Payer: Self-pay

## 2020-07-24 DIAGNOSIS — R202 Paresthesia of skin: Secondary | ICD-10-CM

## 2020-07-31 ENCOUNTER — Telehealth: Payer: Self-pay | Admitting: Physical Medicine and Rehabilitation

## 2020-07-31 NOTE — Telephone Encounter (Signed)
Patient called. She would like an appointment with Dr. Alvester Morin. Her call back number is [phone]

## 2020-09-13 ENCOUNTER — Ambulatory Visit (INDEPENDENT_AMBULATORY_CARE_PROVIDER_SITE_OTHER): Payer: Self-pay | Admitting: Physical Medicine and Rehabilitation

## 2020-09-13 ENCOUNTER — Other Ambulatory Visit: Payer: Self-pay

## 2020-09-13 DIAGNOSIS — R202 Paresthesia of skin: Secondary | ICD-10-CM

## 2020-09-13 NOTE — Progress Notes (Signed)
Pt state both pain in hands. Pt state she cant feel anything in her fingers. Pt is right handed. Pt state tingling, numbness and sharpe pain in her hands. Pt state she feel more pain in her right hand. Right hand dominant.  Numeric Pain Rating Scale and Functional Assessment Average Pain 4   In the last MONTH (on 0-10 scale) has pain interfered with the following?  1. General activity like being  able to carry out your everyday physical activities such as walking, climbing stairs, carrying groceries, or moving a chair?  Rating(8)

## 2020-09-20 ENCOUNTER — Encounter: Payer: Self-pay | Admitting: Orthopaedic Surgery

## 2020-09-20 ENCOUNTER — Other Ambulatory Visit: Payer: Self-pay

## 2020-09-20 ENCOUNTER — Ambulatory Visit (INDEPENDENT_AMBULATORY_CARE_PROVIDER_SITE_OTHER): Payer: Self-pay | Admitting: Orthopaedic Surgery

## 2020-09-20 VITALS — Ht 61.5 in | Wt 172.0 lb

## 2020-09-20 DIAGNOSIS — G5601 Carpal tunnel syndrome, right upper limb: Secondary | ICD-10-CM

## 2020-09-20 DIAGNOSIS — G5603 Carpal tunnel syndrome, bilateral upper limbs: Secondary | ICD-10-CM

## 2020-09-20 DIAGNOSIS — G5602 Carpal tunnel syndrome, left upper limb: Secondary | ICD-10-CM

## 2020-09-20 NOTE — Progress Notes (Signed)
Office Visit Note   Patient: Miranda Welch           Date of Birth: 1981/11/29           MRN: [id number] Visit Date: 09/20/2020              Requested by: Marcine Matar, MD [address] [place],  [place] [address] PCP: Marcine Matar, MD   Assessment & Plan: Visit Diagnoses:  1. Bilateral carpal tunnel syndrome   2. Right carpal tunnel syndrome   3. Left carpal tunnel syndrome     Plan: Impression is bilateral carpal tunnel syndrome severe on the right and moderate on the left.  We have recommended surgical intervention to the right due to the severity on nerve conduction study.  The patient would like to proceed.  We will plan to inject the left carpal tunnel with cortisone at that time.  Risk, benefits and possible complications reviewed.  Rehab recovery time discussed.  She would like to look at scheduling surgery in December some time.  All questions were answered.  Follow-Up Instructions: Return for one week post-op.   Orders:  No orders of the defined types were placed in this encounter.  No orders of the defined types were placed in this encounter.     Procedures: No procedures performed   Clinical Data: No additional findings.   Subjective: Chief Complaint  Patient presents with  . Right Hand - Follow-up  . Left Hand - Follow-up    HPI patient is a pleasant 39 year old Spanish-speaking female who comes in today with an interpreter.  She is here to review nerve conduction studies bilateral upper extremities.  Recent nerve conduction studies severe compression of the median nerve on the right with moderate compression on the left.  She continues to have symptoms consistent with carpal tunnel syndrome.  Review of Systems as detailed in HPI.  All others reviewed and are negative.   Objective: Vital Signs: Ht 5' 1.5" (1.562 m)   Wt 172 lb (78 kg)   BMI 31.97 kg/m   Physical Exam well-developed well-nourished female no acute distress.   Alert oriented x3.  Ortho Exam examination of both upper extremities reveals decreased sensation to the thumb, index and long fingers on the right.  Minimally positive Phalen on the right.  Negative Tinel on the right.  Negative Phalen and Tinel on the left.  No thenar atrophy.    Specialty Comments:  No specialty comments available.  Imaging: No new imaging   PMFS History: Patient Active Problem List   Diagnosis Date Noted  . Abnormal uterine bleeding (AUB) 06/15/2019  . Shoulder pain, right 05/06/2012  . Obesity 05/06/2012  . PALPITATIONS 02/10/2007   Past Medical History:  Diagnosis Date  . Gestational diabetes 2011   oral agent  . Thickened endometrium 08/17/2019    Family History  Problem Relation Age of Onset  . Diabetes Mother   . Hypertension Mother   . Stroke Mother   . Diabetes Father   . Diabetes Sister   . Diabetes Maternal Grandmother   . Diabetes Maternal Grandfather     Past Surgical History:  Procedure Laterality Date  . NO PAST SURGERIES     Social History   Occupational History  . Not on file  Tobacco Use  . Smoking status: Never Smoker  . Smokeless tobacco: Never Used  Vaping Use  . Vaping Use: Never used  Substance and Sexual Activity  . Alcohol use: No  .  Drug use: No  . Sexual activity: Yes    Birth control/protection: Pill

## 2020-09-20 NOTE — Procedures (Signed)
EMG & NCV Findings: Evaluation of the left median motor nerve showed prolonged distal onset latency (5.6 ms) and decreased conduction velocity (Elbow-Wrist, 49 m/s).  The right median motor nerve showed prolonged distal onset latency (7.0 ms), reduced amplitude (4.0 mV), and decreased conduction velocity (Elbow-Wrist, 43 m/s).  The left median (across palm) sensory nerve showed prolonged distal peak latency (Wrist, 6.3 ms), reduced amplitude (5.9 V), and prolonged distal peak latency (Palm, 4.2 ms).  The right median (across palm) sensory nerve showed no response (Wrist) and prolonged distal peak latency (Palm, 4.3 ms).  All remaining nerves (as indicated in the following tables) were within normal limits.  Left vs. Right side comparison data for the median motor nerve indicates abnormal L-R latency difference (1.4 ms).  All remaining left vs. right side differences were within normal limits.    Needle evaluation of the right abductor pollicis brevis muscle showed slightly increased spontaneous activity.  All remaining muscles (as indicated in the following table) showed no evidence of electrical instability.    Impression: The above electrodiagnostic study is ABNORMAL and reveals evidence of:  1.  A severe right median nerve entrapment at the wrist (carpal tunnel syndrome) affecting sensory and motor components. There is no significant electrodiagnostic evidence of any other focal nerve entrapment, brachial plexopathy or cervical radiculopathy.  2.  A  moderate to severe left median nerve entrapment at the wrist (carpal tunnel syndrome) affecting sensory and motor components.   Recommendations: 1.  Follow-up with referring physician. 2.  Continue current management of symptoms. 3.  Continue use of resting splint at night-time and as needed during the day. 4.  Suggest surgical evaluation.  ___________________________ Naaman Plummer FAAPMR Board Certified, American Board of Physical Medicine and  Rehabilitation    Nerve Conduction Studies Anti Sensory Summary Table   Stim Site NR Peak (ms) Norm Peak (ms) P-T Amp (V) Norm P-T Amp Site1 Site2 Delta-P (ms) Dist (cm) Vel (m/s) Norm Vel (m/s)  Left Median Acr Palm Anti Sensory (2nd Digit)  31C  Wrist    *6.3 <3.6 *5.9 >10 Wrist Palm 2.1 0.0    Palm    *4.2 <2.0 6.1         Right Median Acr Palm Anti Sensory (2nd Digit)  30.4C  Wrist *NR  <3.6  >10 Wrist Palm  0.0    Palm    *4.3 <2.0 2.7         Left Radial Anti Sensory (Base 1st Digit)  31C  Wrist    1.9 <3.1 30.8  Wrist Base 1st Digit 1.9 0.0    Right Radial Anti Sensory (Base 1st Digit)  30.8C  Wrist    1.9 <3.1 47.3  Wrist Base 1st Digit 1.9 0.0    Left Ulnar Anti Sensory (5th Digit)  31.2C  Wrist    3.0 <3.7 35.7 >15.0 Wrist 5th Digit 3.0 14.0 47 >38  Right Ulnar Anti Sensory (5th Digit)  30.7C  Wrist    3.0 <3.7 34.5 >15.0 Wrist 5th Digit 3.0 14.0 47 >38   Motor Summary Table   Stim Site NR Onset (ms) Norm Onset (ms) O-P Amp (mV) Norm O-P Amp Site1 Site2 Delta-0 (ms) Dist (cm) Vel (m/s) Norm Vel (m/s)  Left Median Motor (Abd Poll Brev)  31.1C  Wrist    *5.6 <4.2 7.3 >5 Elbow Wrist 3.4 16.5 *49 >50  Elbow    9.0  5.7         Right Median Motor (Abd Poll Brev)  30.9C  Wrist    *7.0 <4.2 *4.0 >5 Elbow Wrist 4.1 17.5 *43 >50  Elbow    11.1  3.8         Left Ulnar Motor (Abd Dig Min)  31.3C  Wrist    2.7 <4.2 9.4 >3 B Elbow Wrist 2.6 15.5 60 >53  B Elbow    5.3  9.9  A Elbow B Elbow 1.0 10.0 100 >53  A Elbow    6.3  9.9         Right Ulnar Motor (Abd Dig Min)  31.1C  Wrist    2.8 <4.2 11.3 >3 B Elbow Wrist 2.5 17.0 68 >53  B Elbow    5.3  12.3  A Elbow B Elbow 1.0 10.0 100 >53  A Elbow    6.3  12.5          EMG   Side Muscle Nerve Root Ins Act Fibs Psw Amp Dur Poly Recrt Int Dennie Bible Comment  Right Abd Poll Brev Median C8-T1 Nml *1+ *1+ Nml Nml 0 Nml Nml   Right 1stDorInt Ulnar C8-T1 Nml Nml Nml Nml Nml 0 Nml Nml     Nerve Conduction Studies Anti Sensory  Left/Right Comparison   Stim Site L Lat (ms) R Lat (ms) L-R Lat (ms) L Amp (V) R Amp (V) L-R Amp (%) Site1 Site2 L Vel (m/s) R Vel (m/s) L-R Vel (m/s)  Median Acr Palm Anti Sensory (2nd Digit)  31C  Wrist *6.3   *5.9   Wrist Palm     Palm *4.2 *4.3 0.1 6.1 2.7 55.7       Radial Anti Sensory (Base 1st Digit)  31C  Wrist 1.9 1.9 0.0 30.8 47.3 34.9 Wrist Base 1st Digit     Ulnar Anti Sensory (5th Digit)  31.2C  Wrist 3.0 3.0 0.0 35.7 34.5 3.4 Wrist 5th Digit 47 47 0   Motor Left/Right Comparison   Stim Site L Lat (ms) R Lat (ms) L-R Lat (ms) L Amp (mV) R Amp (mV) L-R Amp (%) Site1 Site2 L Vel (m/s) R Vel (m/s) L-R Vel (m/s)  Median Motor (Abd Poll Brev)  31.1C  Wrist *5.6 *7.0 *1.4 7.3 *4.0 45.2 Elbow Wrist *49 *43 6  Elbow 9.0 11.1 2.1 5.7 3.8 33.3       Ulnar Motor (Abd Dig Min)  31.3C  Wrist 2.7 2.8 0.1 9.4 11.3 16.8 B Elbow Wrist 60 68 8  B Elbow 5.3 5.3 0.0 9.9 12.3 19.5 A Elbow B Elbow 100 100 0  A Elbow 6.3 6.3 0.0 9.9 12.5 20.8          Waveforms:

## 2020-09-20 NOTE — Progress Notes (Signed)
Miranda Welch - 38 y.o. female MRN [id number]  Date of birth: [birth year]  Office Visit Note: Visit Date: 09/13/2020 PCP: Marcine Matar, MD Referred by: Marcine Matar, MD  Subjective: No chief complaint on file.  HPI:  Miranda Welch is a 39 y.o. female who comes in today at the request of Dr. Glee Arvin for electrodiagnostic study of the Bilateral upper extremities.  Patient is Right hand dominant.  Patient is negative Spanish-speaking and interpreter is present in the room.  She has about a 4+ month history of progressive worsening numbness and tingling and pain in both hands.  She denies any radicular complaints.  No history of diabetes or prior electrodiagnostic studies.  She reports pretty profound numbness at times in the fingers and shows me predominantly the radial digits.  She does have worsening symptoms on the right than the left.  It is worse with certain positions and at night.  She rates her pain as a 4 out of 10.   ROS Otherwise per HPI.  Assessment & Plan: Visit Diagnoses:  1. Paresthesia of skin     Plan: Impression: The above electrodiagnostic study is ABNORMAL and reveals evidence of:  1.  A severe right median nerve entrapment at the wrist (carpal tunnel syndrome) affecting sensory and motor components. There is no significant electrodiagnostic evidence of any other focal nerve entrapment, brachial plexopathy or cervical radiculopathy.  2.  A  moderate to severe left median nerve entrapment at the wrist (carpal tunnel syndrome) affecting sensory and motor components.   Recommendations: 1.  Follow-up with referring physician. 2.  Continue current management of symptoms. 3.  Continue use of resting splint at night-time and as needed during the day. 4.  Suggest surgical evaluation.   Meds & Orders: No orders of the defined types were placed in this encounter.   Orders Placed This Encounter  Procedures  . NCV with EMG (electromyography)      Follow-up: Return for  Glee Arvin, M.D..   Procedures: No procedures performed  EMG & NCV Findings: Evaluation of the left median motor nerve showed prolonged distal onset latency (5.6 ms) and decreased conduction velocity (Elbow-Wrist, 49 m/s).  The right median motor nerve showed prolonged distal onset latency (7.0 ms), reduced amplitude (4.0 mV), and decreased conduction velocity (Elbow-Wrist, 43 m/s).  The left median (across palm) sensory nerve showed prolonged distal peak latency (Wrist, 6.3 ms), reduced amplitude (5.9 V), and prolonged distal peak latency (Palm, 4.2 ms).  The right median (across palm) sensory nerve showed no response (Wrist) and prolonged distal peak latency (Palm, 4.3 ms).  All remaining nerves (as indicated in the following tables) were within normal limits.  Left vs. Right side comparison data for the median motor nerve indicates abnormal L-R latency difference (1.4 ms).  All remaining left vs. right side differences were within normal limits.    Needle evaluation of the right abductor pollicis brevis muscle showed slightly increased spontaneous activity.  All remaining muscles (as indicated in the following table) showed no evidence of electrical instability.    Impression: The above electrodiagnostic study is ABNORMAL and reveals evidence of:  1.  A severe right median nerve entrapment at the wrist (carpal tunnel syndrome) affecting sensory and motor components. There is no significant electrodiagnostic evidence of any other focal nerve entrapment, brachial plexopathy or cervical radiculopathy.  2.  A  moderate to severe left median nerve entrapment at the wrist (carpal tunnel syndrome) affecting sensory and motor components.  Recommendations: 1.  Follow-up with referring physician. 2.  Continue current management of symptoms. 3.  Continue use of resting splint at night-time and as needed during the day. 4.  Suggest surgical  evaluation.  ___________________________ Naaman Plummer FAAPMR Board Certified, American Board of Physical Medicine and Rehabilitation    Nerve Conduction Studies Anti Sensory Summary Table   Stim Site NR Peak (ms) Norm Peak (ms) P-T Amp (V) Norm P-T Amp Site1 Site2 Delta-P (ms) Dist (cm) Vel (m/s) Norm Vel (m/s)  Left Median Acr Palm Anti Sensory (2nd Digit)  31C  Wrist    *6.3 <3.6 *5.9 >10 Wrist Palm 2.1 0.0    Palm    *4.2 <2.0 6.1         Right Median Acr Palm Anti Sensory (2nd Digit)  30.4C  Wrist *NR  <3.6  >10 Wrist Palm  0.0    Palm    *4.3 <2.0 2.7         Left Radial Anti Sensory (Base 1st Digit)  31C  Wrist    1.9 <3.1 30.8  Wrist Base 1st Digit 1.9 0.0    Right Radial Anti Sensory (Base 1st Digit)  30.8C  Wrist    1.9 <3.1 47.3  Wrist Base 1st Digit 1.9 0.0    Left Ulnar Anti Sensory (5th Digit)  31.2C  Wrist    3.0 <3.7 35.7 >15.0 Wrist 5th Digit 3.0 14.0 47 >38  Right Ulnar Anti Sensory (5th Digit)  30.7C  Wrist    3.0 <3.7 34.5 >15.0 Wrist 5th Digit 3.0 14.0 47 >38   Motor Summary Table   Stim Site NR Onset (ms) Norm Onset (ms) O-P Amp (mV) Norm O-P Amp Site1 Site2 Delta-0 (ms) Dist (cm) Vel (m/s) Norm Vel (m/s)  Left Median Motor (Abd Poll Brev)  31.1C  Wrist    *5.6 <4.2 7.3 >5 Elbow Wrist 3.4 16.5 *49 >50  Elbow    9.0  5.7         Right Median Motor (Abd Poll Brev)  30.9C  Wrist    *7.0 <4.2 *4.0 >5 Elbow Wrist 4.1 17.5 *43 >50  Elbow    11.1  3.8         Left Ulnar Motor (Abd Dig Min)  31.3C  Wrist    2.7 <4.2 9.4 >3 B Elbow Wrist 2.6 15.5 60 >53  B Elbow    5.3  9.9  A Elbow B Elbow 1.0 10.0 100 >53  A Elbow    6.3  9.9         Right Ulnar Motor (Abd Dig Min)  31.1C  Wrist    2.8 <4.2 11.3 >3 B Elbow Wrist 2.5 17.0 68 >53  B Elbow    5.3  12.3  A Elbow B Elbow 1.0 10.0 100 >53  A Elbow    6.3  12.5          EMG   Side Muscle Nerve Root Ins Act Fibs Psw Amp Dur Poly Recrt Int Dennie Bible Comment  Right Abd Poll Brev Median C8-T1 Nml *1+ *1+ Nml Nml  0 Nml Nml   Right 1stDorInt Ulnar C8-T1 Nml Nml Nml Nml Nml 0 Nml Nml     Nerve Conduction Studies Anti Sensory Left/Right Comparison   Stim Site L Lat (ms) R Lat (ms) L-R Lat (ms) L Amp (V) R Amp (V) L-R Amp (%) Site1 Site2 L Vel (m/s) R Vel (m/s) L-R Vel (m/s)  Median Acr Palm Anti Sensory (2nd Digit)  31C  Wrist *6.3   *5.9   Wrist Palm     Palm *4.2 *4.3 0.1 6.1 2.7 55.7       Radial Anti Sensory (Base 1st Digit)  31C  Wrist 1.9 1.9 0.0 30.8 47.3 34.9 Wrist Base 1st Digit     Ulnar Anti Sensory (5th Digit)  31.2C  Wrist 3.0 3.0 0.0 35.7 34.5 3.4 Wrist 5th Digit 47 47 0   Motor Left/Right Comparison   Stim Site L Lat (ms) R Lat (ms) L-R Lat (ms) L Amp (mV) R Amp (mV) L-R Amp (%) Site1 Site2 L Vel (m/s) R Vel (m/s) L-R Vel (m/s)  Median Motor (Abd Poll Brev)  31.1C  Wrist *5.6 *7.0 *1.4 7.3 *4.0 45.2 Elbow Wrist *49 *43 6  Elbow 9.0 11.1 2.1 5.7 3.8 33.3       Ulnar Motor (Abd Dig Min)  31.3C  Wrist 2.7 2.8 0.1 9.4 11.3 16.8 B Elbow Wrist 60 68 8  B Elbow 5.3 5.3 0.0 9.9 12.3 19.5 A Elbow B Elbow 100 100 0  A Elbow 6.3 6.3 0.0 9.9 12.5 20.8          Waveforms:                      Clinical History: No specialty comments available.     Objective:  VS:  HT:    WT:   BMI:     BP:   HR: bpm  TEMP: ( )  RESP:  Physical Exam Musculoskeletal:        General: No swelling, tenderness or deformity.     Comments: Inspection reveals no atrophy of the bilateral APB or FDI or hand intrinsics. There is no swelling, color changes, allodynia or dystrophic changes. There is 5 out of 5 strength in the bilateral wrist extension, finger abduction and long finger flexion.  Subjective impaired sensation on the right median nerve distribution compared to left.. There is a positive Phalen's test bilaterally. There is a negative Hoffmann's test bilaterally.  Skin:    General: Skin is warm and dry.     Findings: No erythema or rash.  Neurological:     General: No focal  deficit present.     Mental Status: She is alert and oriented to person, place, and time.     Motor: No weakness or abnormal muscle tone.     Coordination: Coordination normal.  Psychiatric:        Mood and Affect: Mood normal.        Behavior: Behavior normal.      Imaging: No results found.

## 2020-11-20 ENCOUNTER — Inpatient Hospital Stay: Payer: Self-pay | Admitting: Orthopaedic Surgery

## 2020-12-18 ENCOUNTER — Other Ambulatory Visit: Payer: Self-pay

## 2020-12-18 ENCOUNTER — Encounter (HOSPITAL_BASED_OUTPATIENT_CLINIC_OR_DEPARTMENT_OTHER): Payer: Self-pay | Admitting: Orthopaedic Surgery

## 2020-12-18 ENCOUNTER — Ambulatory Visit: Payer: Self-pay | Attending: Internal Medicine

## 2020-12-18 ENCOUNTER — Ambulatory Visit: Payer: Self-pay | Admitting: Physician Assistant

## 2020-12-18 VITALS — BP 129/93 | HR 85 | Temp 97.5°F

## 2020-12-18 DIAGNOSIS — Z13220 Encounter for screening for lipoid disorders: Secondary | ICD-10-CM

## 2020-12-18 DIAGNOSIS — E559 Vitamin D deficiency, unspecified: Secondary | ICD-10-CM

## 2020-12-18 DIAGNOSIS — R5383 Other fatigue: Secondary | ICD-10-CM

## 2020-12-18 DIAGNOSIS — D649 Anemia, unspecified: Secondary | ICD-10-CM

## 2020-12-18 DIAGNOSIS — R7303 Prediabetes: Secondary | ICD-10-CM

## 2020-12-18 LAB — POCT GLYCOSYLATED HEMOGLOBIN (HGB A1C): Hemoglobin A1C: 5.1 % (ref 4.0–5.6)

## 2020-12-18 NOTE — Progress Notes (Signed)
Established Patient Office Visit  Subjective:  Patient ID: Miranda Welch, female    DOB: 03/08/1981  Age: 40 y.o. MRN: [id number]  CC:  Chief Complaint  Patient presents with   Fatigue    HPI Miranda Welch states that she would like to have her vit d levels and her A1C checked.  States that she was diagnosed with vitamin D deficiency in June 2021 and was prescribed weekly dose of vitamin D, states that she started taking them once a week for 12 weeks .  States that she does have some difficulties with her vision, describes it as hard time seeing, worse when she is driving. No recent vision exam, does not wear glasses.  Reports that this has been ongoing for the last couple of years.  States that she is having fatigue, states that this has been present for the past month.  States that she is sleeping approx 8 hours a night.  Denies any changes to her menses, denies previous diagnosis of anemia  Endorses that she had gestational diabetes 7 years ago, does not check BG at home .  Endorses that she has been working on a lower sugar diet.  Due to language barrier, an interpreter was present during the history-taking and subsequent discussion (and for part of the physical exam) with this patient.   Past Medical History:  Diagnosis Date   Gestational diabetes    Thickened endometrium 08/17/2019    Past Surgical History:  Procedure Laterality Date   NO PAST SURGERIES      Family History  Problem Relation Age of Onset   Diabetes Mother    Hypertension Mother    Stroke Mother    Diabetes Father    Diabetes Sister    Diabetes Maternal Grandmother    Diabetes Maternal Grandfather     Social History   Socioeconomic History   Marital status: Married    Spouse name: Not on file   Number of children: 3   Years of education: 12   Highest education level: 12th grade  Occupational History   Not on file  Tobacco Use   Smoking status: Never Smoker    Smokeless tobacco: Never Used  Building services engineer Use: Never used  Substance and Sexual Activity   Alcohol use: No   Drug use: No   Sexual activity: Yes    Birth control/protection: Pill  Other Topics Concern   Not on file  Social History Narrative   Moved from [place] 10 yrs ago, lives with husband and 2 daughters (2011, 2004). Stay at home mother. Walks for exercise irregularly.   Social Determinants of Health   Financial Resource Strain: Not on file  Food Insecurity: Not on file  Transportation Needs: No Transportation Needs   Lack of Transportation (Medical): No   Lack of Transportation (Non-Medical): No  Physical Activity: Not on file  Stress: Not on file  Social Connections: Not on file  Intimate Partner Violence: Not on file    No outpatient medications prior to visit.   No facility-administered medications prior to visit.    No Known Allergies  ROS Review of Systems  Constitutional: Positive for fatigue. Negative for chills and fever.  HENT: Negative.   Eyes: Negative.   Respiratory: Negative.   Cardiovascular: Negative.   Gastrointestinal: Negative.   Endocrine: Negative for polydipsia and polyuria.  Genitourinary: Negative.   Musculoskeletal: Negative.   Skin: Negative.   Allergic/Immunologic: Negative.   Neurological: Negative for dizziness,  weakness and headaches.  Hematological: Negative.   Psychiatric/Behavioral: Negative.       Objective:    Physical Exam  BP (!) 129/93    Pulse 85    Temp (!) 97.5 F (36.4 C) (Oral)    LMP 12/02/2020 (Approximate)  Wt Readings from Last 3 Encounters:  09/20/20 172 lb (78 kg)  07/23/20 172 lb 12.8 oz (78.4 kg)  02/15/20 168 lb (76.2 kg)   BP (!) 129/93    Pulse 85    Temp (!) 97.5 F (36.4 C) (Oral)    LMP 12/02/2020 (Approximate)   General Appearance:    Alert, cooperative, no distress, appears stated age  Head:    Normocephalic, without obvious abnormality, atraumatic  Eyes:    PERRL,  conjunctiva/corneas clear, EOM's intact, fundi    benign, both eyes  Ears:    Normal TM's and external ear canals, both ears  Nose:   Nares normal, septum midline, mucosa normal, no drainage    or sinus tenderness  Throat:   Lips, mucosa, and tongue normal; teeth and gums normal  Neck:   Supple, symmetrical, trachea midline, no adenopathy;    thyroid:  no enlargement/tenderness/nodules; no carotid   bruit or JVD  Back:     Symmetric, no curvature, ROM normal, no CVA tenderness  Lungs:     Clear to auscultation bilaterally, respirations unlabored  Chest Wall:    No tenderness or deformity   Heart:    Regular rate and rhythm, S1 and S2 normal, no murmur, rub   or gallop              Extremities:   Extremities normal, atraumatic, no cyanosis or edema  Pulses:   2+ and symmetric all extremities  Skin:   Skin color, texture, turgor normal, no rashes or lesions  Lymph nodes:   Cervical, supraclavicular, and axillary nodes normal  Neurologic:   CNII-XII intact, normal strength, sensation and reflexes    throughout     Health Maintenance Due  Topic Date Due   Hepatitis C Screening  Never done   PNEUMOCOCCAL POLYSACCHARIDE VACCINE AGE 97-64 HIGH RISK  Never done   COVID-19 Vaccine (1) Never done   FOOT EXAM  Never done   OPHTHALMOLOGY EXAM  Never done   URINE MICROALBUMIN  Never done   INFLUENZA VACCINE  Never done    There are no preventive care reminders to display for this patient.  Lab Results  Component Value Date   TSH 2.310 12/18/2020   Lab Results  Component Value Date   WBC 8.3 12/18/2020   HGB 9.5 (L) 12/18/2020   HCT 33.9 (L) 12/18/2020   MCV 65 (L) 12/18/2020   PLT 299 12/18/2020   Lab Results  Component Value Date   NA 138 12/28/2019   K 4.8 12/28/2019   CO2 20 12/28/2019   GLUCOSE 83 12/28/2019   BUN 17 12/28/2019   CREATININE 0.80 12/28/2019   BILITOT <0.2 12/28/2019   ALKPHOS 53 12/28/2019   AST 15 12/28/2019   ALT 11 12/28/2019   PROT 7.1  12/28/2019   ALBUMIN 4.1 12/28/2019   CALCIUM 9.2 12/28/2019   Lab Results  Component Value Date   CHOL 162 10/18/2019   Lab Results  Component Value Date   HDL 43 10/18/2019   Lab Results  Component Value Date   LDLCALC 75 10/18/2019   Lab Results  Component Value Date   TRIG 268 (H) 10/18/2019   Lab Results  Component Value  Date   CHOLHDL 3.8 10/18/2019   Lab Results  Component Value Date   HGBA1C 5.1 12/18/2020      Assessment & Plan:   Problem List Items Addressed This Visit      Other   Vitamin D deficiency - Primary   Relevant Orders   Vitamin D, 25-hydroxy (Completed)   Prediabetes   Relevant Orders   HgB A1c (Completed)   Fatigue   Relevant Orders   CBC with Differential/Platelet (Completed)   TSH (Completed)    Other Visit Diagnoses    Screening, lipid        1. Vitamin D deficiency Patient encouraged to [facility] draw. - Vitamin D, 25-hydroxy; Future  2. Prediabetes A1c improved from 5.7-5.1.  Patient encouraged to continue working on low sugar diet.  Patient encouraged to schedule optometry exam, patient understands and agrees - HgB A1c  3. Fatigue, unspecified type Patient education given - CBC with Differential/Platelet; Future - TSH; Future  4. Screening, lipid   I have reviewed the patient's medical history (PMH, PSH, Social History, Family History, Medications, and allergies) , and have been updated if relevant. I spent 30 minutes reviewing chart and  face to face time with patient.      No orders of the defined types were placed in this encounter.   Follow-up: Return if symptoms worsen or fail to improve.    Kasandra Knudsen Mayers, PA-C

## 2020-12-18 NOTE — Patient Instructions (Signed)
Please [facility] to have your labs completed.  I will call you with your lab results.  I encourage you to call and schedule a vision exam.  Please let us know if there is anything else we can do for you.  Kennieth Rad, PA-C Physician Assistant [facility] [email or web address]    Fatiga Fatigue Si siente fatiga, tiene una sensacin de cansancio en todo momento y falta de energa o falta de motivacin. La fatiga puede dificultar el inicio o la realizacin de tareas debido al agotamiento. Por lo general, la fatiga ocasional o leve con frecuencia es una reaccin normal a la actividad o la vida. Sin embargo, la fatiga de Engineer, site duracin (crnica) o extrema puede ser sntoma de una enfermedad. Siga estas indicaciones en su casa: Instrucciones generales  Controle su fatiga para ver si hay cambios.  Acustese y levntese a la misma hora todos [place].  Evite la fatiga moderando su ritmo [place] y durmiendo lo suficiente durante la noche.  Mantenga un peso saludable. [place] los medicamentos de venta libre y los recetados solamente como se lo haya indicado el mdico.  Tome una multivitamina, si se lo indic el mdico.  No tome ningn complemento alimenticio o a base de hierbas a menos que lo haya autorizado el mdico. Actividad   Realice ejercicio con regularidad como se lo haya indicado el mdico.  Practique tcnicas que lo ayuden a Nurse, children's, como yoga, tai chi, meditacin, terapia de masajes. Comida y bebida   Evite las comidas abundantes a la noche.  Consuma una dieta bien balanceada que incluya protenas magras, cereales integrales, abundantes frutas, verduras y productos lcteos descremados.  Evite el consumo excesivo de cafena.  Evite el consumo de alcohol.  Beba suficiente lquido para Consulting civil engineer orina de color amarillo plido. [place] situaciones que le provocan estrs. Trate de que su horario de [place] y personal estn equilibrados.  No consuma ningn producto que contenga nicotina o tabaco, como cigarrillos y Psychologist, sport and exercise. Si necesita ayuda para dejar de fumar, consulte al mdico.  No consuma drogas. Comunquese con un mdico si:  La fatiga no mejora.  Tiene fiebre.  Pierde peso o [place] de peso de forma repentina.  Tiene dolores de [place].  Tiene problemas para conciliar el sueo o para dormir en la noche.  Se siente enojado, culpable, ansioso o triste.  No puede defecar (estreimiento).  Tiene la piel seca.  Observa hinchazn en las piernas u otras partes del cuerpo. Solicite ayuda de inmediato si:  Se siente confundido.  Tiene visin borrosa.  Tiene mareos o se desmaya.  Tiene un dolor de cabeza intenso.  Siente dolor intenso en el abdomen, la espalda o el rea entre la cintura y las caderas (pelvis).  Tiene dolor de pecho, dificultad para respirar, o latidos cardacos irregulares o acelerados.  No puede orinar u [employer] de lo normal.  Presenta sangrado anormal, como sangrado del recto, la vagina, la nariz, los pulmones o los pezones.  Vomita sangre.  Tiene pensamientos acerca de lastimarse a usted mismo o a Producer, television/film/video. Si alguna vez siente que puede lastimarse o Market researcher a los dems, o tiene pensamientos de poner fin a su vida, busque ayuda de inmediato. Puede dirigirse al servicio de emergencias ms cercano o comunicarse con:  El servicio de Therapist, sports (911 en los [place]).  Una lnea de asistencia al suicida y atencin en crisis,  [place] (National Suicide Prevention Lifeline) al [phone]. Est disponible las 24 horas del da. Resumen  Si siente fatiga, tiene una sensacin de cansancio en todo momento y falta de energa o falta de motivacin.  La fatiga puede dificultar el inicio o la realizacin de  tareas debido al agotamiento.  La fatiga de larga duracin (crnica) o extrema puede ser sntoma de una enfermedad.  Realice ejercicio con regularidad como se lo haya indicado el mdico.  Cambie las situaciones que le provocan estrs. Trate de que su horario de [place] y personal estn equilibrados. Esta informacin no tiene Marine scientist el consejo del mdico. Asegrese de hacerle al mdico cualquier pregunta que tenga. Document Revised: 11/17/2017 Document Reviewed: 11/17/2017 Elsevier Patient Education  2020 [employer].

## 2020-12-19 ENCOUNTER — Telehealth: Payer: Self-pay | Admitting: *Deleted

## 2020-12-19 DIAGNOSIS — E559 Vitamin D deficiency, unspecified: Secondary | ICD-10-CM | POA: Insufficient documentation

## 2020-12-19 DIAGNOSIS — D649 Anemia, unspecified: Secondary | ICD-10-CM | POA: Insufficient documentation

## 2020-12-19 DIAGNOSIS — R7303 Prediabetes: Secondary | ICD-10-CM | POA: Insufficient documentation

## 2020-12-19 DIAGNOSIS — R5383 Other fatigue: Secondary | ICD-10-CM | POA: Insufficient documentation

## 2020-12-19 LAB — TSH: TSH: 2.31 u[IU]/mL (ref 0.450–4.500)

## 2020-12-19 LAB — CBC WITH DIFFERENTIAL/PLATELET
Basophils Absolute: 0 10*3/uL (ref 0.0–0.2)
Basos: 0 %
EOS (ABSOLUTE): 0.1 10*3/uL (ref 0.0–0.4)
Eos: 1 %
Hematocrit: 33.9 % — ABNORMAL LOW (ref 34.0–46.6)
Hemoglobin: 9.5 g/dL — ABNORMAL LOW (ref 11.1–15.9)
Immature Grans (Abs): 0 10*3/uL (ref 0.0–0.1)
Immature Granulocytes: 0 %
Lymphocytes Absolute: 3.1 10*3/uL (ref 0.7–3.1)
Lymphs: 37 %
MCH: 18.3 pg — ABNORMAL LOW (ref 26.6–33.0)
MCHC: 28 g/dL — ABNORMAL LOW (ref 31.5–35.7)
MCV: 65 fL — ABNORMAL LOW (ref 79–97)
Monocytes Absolute: 0.4 10*3/uL (ref 0.1–0.9)
Monocytes: 5 %
Neutrophils Absolute: 4.6 10*3/uL (ref 1.4–7.0)
Neutrophils: 57 %
Platelets: 299 10*3/uL (ref 150–450)
RBC: 5.19 x10E6/uL (ref 3.77–5.28)
RDW: 20.5 % — ABNORMAL HIGH (ref 11.7–15.4)
WBC: 8.3 10*3/uL (ref 3.4–10.8)

## 2020-12-19 LAB — VITAMIN D 25 HYDROXY (VIT D DEFICIENCY, FRACTURES): Vit D, 25-Hydroxy: 29.8 ng/mL — ABNORMAL LOW (ref 30.0–100.0)

## 2020-12-19 MED ORDER — FERROUS SULFATE 325 (65 FE) MG PO TABS: 325.0000 mg | ORAL_TABLET | Freq: Two times a day (BID) | ORAL | 0 refills | Status: DC

## 2020-12-19 NOTE — Telephone Encounter (Signed)
Medical Assistant used [employer] to contact patient.  Interpreter Name: Interpreter #: [phone] Patient verified DOB Patient is aware of labs showing improved Vitamin D and now needs to take OTC 2000 units daily. Patient is also aware of hemoglobin being low and iron supplements needing to be taken daily. Begin with 1 tablet daily and if tolerated well she will increase to 2 tablets. Patient is scheduled for tomorrow lab visit for iron panel.

## 2020-12-19 NOTE — Addendum Note (Signed)
Addended by: Roney Jaffe on: 12/19/2020 08:47 AM   Modules accepted: Orders

## 2020-12-19 NOTE — Telephone Encounter (Signed)
-----   Message from Roney Jaffe, [place] sent at 12/19/2020  8:47 AM EST ----- Please call patient and let her know that her vitamin D levels have much improved, she should take 2000 units once daily, she can purchase this over-the-counter.  Her hemoglobin is low, she needs to start iron supplements, and [facility] for a follow-up lab.  Start with low dose of ferrous sulfate 325 mgm once daily, and if tolerated, increase dose to 2 tabs daily.

## 2020-12-20 ENCOUNTER — Ambulatory Visit: Payer: Self-pay

## 2020-12-20 ENCOUNTER — Other Ambulatory Visit: Payer: Self-pay

## 2020-12-20 NOTE — Progress Notes (Signed)

## 2020-12-21 ENCOUNTER — Other Ambulatory Visit (HOSPITAL_COMMUNITY)
Admission: RE | Admit: 2020-12-21 | Discharge: 2020-12-21 | Disposition: A | Discharge status: Home / Self Care | Payer: Self-pay | Source: Ambulatory Visit | Attending: Orthopaedic Surgery | Admitting: Orthopaedic Surgery

## 2020-12-21 DIAGNOSIS — Z20822 Contact with and (suspected) exposure to covid-19: Secondary | ICD-10-CM | POA: Insufficient documentation

## 2020-12-21 DIAGNOSIS — Z01812 Encounter for preprocedural laboratory examination: Secondary | ICD-10-CM | POA: Insufficient documentation

## 2020-12-21 LAB — IRON,TIBC AND FERRITIN PANEL
Ferritin: 5 ng/mL — ABNORMAL LOW (ref 15–150)
Iron Saturation: 4 % — CL (ref 15–55)
Iron: 22 ug/dL — ABNORMAL LOW (ref 27–159)
Total Iron Binding Capacity: 602 ug/dL (ref 250–450)
UIBC: 580 ug/dL — ABNORMAL HIGH (ref 131–425)

## 2020-12-21 LAB — SPECIMEN STATUS REPORT

## 2020-12-22 LAB — SARS CORONAVIRUS 2 (TAT 6-24 HRS): SARS Coronavirus 2: NEGATIVE

## 2020-12-23 ENCOUNTER — Other Ambulatory Visit: Payer: Self-pay

## 2020-12-23 DIAGNOSIS — N644 Mastodynia: Secondary | ICD-10-CM

## 2020-12-24 ENCOUNTER — Ambulatory Visit: Payer: Self-pay | Attending: Internal Medicine

## 2020-12-24 ENCOUNTER — Other Ambulatory Visit: Payer: Self-pay

## 2020-12-24 ENCOUNTER — Telehealth: Payer: Self-pay | Admitting: *Deleted

## 2020-12-24 NOTE — Telephone Encounter (Signed)
Medical Assistant used [employer] to contact patient.  Interpreter Name: Nicholes Rough Interpreter #: [id number] Patient verified DOB Patient is aware of iron being low and needing to take iron supplements daily. Patient will begin with 1 tablet for 3 days and increase to 1 tablet in the morning and 1 in the evening.

## 2020-12-24 NOTE — Telephone Encounter (Signed)
-----   Message from Roney Jaffe, [place] sent at 12/24/2020  8:58 AM EST ----- Please call patient and let her know that her iron is very low as we suspected, she should continue with the plan of taking iron on a daily basis, building up to taking it twice a day.  She should have her iron levels rechecked in approximately 6 weeks.

## 2020-12-25 ENCOUNTER — Encounter (HOSPITAL_BASED_OUTPATIENT_CLINIC_OR_DEPARTMENT_OTHER)
Admission: RE | Disposition: A | Discharge status: Home / Self Care | Payer: Self-pay | Source: Home / Self Care | Attending: Orthopaedic Surgery

## 2020-12-25 ENCOUNTER — Ambulatory Visit (HOSPITAL_BASED_OUTPATIENT_CLINIC_OR_DEPARTMENT_OTHER): Payer: Self-pay | Admitting: Anesthesiology

## 2020-12-25 ENCOUNTER — Encounter: Payer: Self-pay | Admitting: Orthopaedic Surgery

## 2020-12-25 ENCOUNTER — Ambulatory Visit (HOSPITAL_BASED_OUTPATIENT_CLINIC_OR_DEPARTMENT_OTHER)
Admission: RE | Admit: 2020-12-25 | Discharge: 2020-12-25 | Disposition: A | Discharge status: Home / Self Care | Payer: Self-pay | Attending: Orthopaedic Surgery | Admitting: Orthopaedic Surgery

## 2020-12-25 ENCOUNTER — Encounter (HOSPITAL_BASED_OUTPATIENT_CLINIC_OR_DEPARTMENT_OTHER): Payer: Self-pay | Admitting: Orthopaedic Surgery

## 2020-12-25 ENCOUNTER — Other Ambulatory Visit: Payer: Self-pay

## 2020-12-25 DIAGNOSIS — G5603 Carpal tunnel syndrome, bilateral upper limbs: Secondary | ICD-10-CM | POA: Insufficient documentation

## 2020-12-25 DIAGNOSIS — Z79899 Other long term (current) drug therapy: Secondary | ICD-10-CM | POA: Insufficient documentation

## 2020-12-25 DIAGNOSIS — G5602 Carpal tunnel syndrome, left upper limb: Secondary | ICD-10-CM

## 2020-12-25 DIAGNOSIS — G5601 Carpal tunnel syndrome, right upper limb: Secondary | ICD-10-CM

## 2020-12-25 HISTORY — PX: CARPAL TUNNEL RELEASE: SHX101

## 2020-12-25 LAB — POCT PREGNANCY, URINE: Preg Test, Ur: NEGATIVE

## 2020-12-25 SURGERY — CARPAL TUNNEL RELEASE
Anesthesia: General | Site: Wrist | Laterality: Right

## 2020-12-25 MED ORDER — MEPERIDINE HCL 25 MG/ML IJ SOLN: 6.2500 mg | INTRAMUSCULAR | Status: DC | PRN

## 2020-12-25 MED ORDER — TRIAMCINOLONE ACETONIDE 40 MG/ML IJ SUSP
INTRAMUSCULAR | Status: AC
  Filled 2020-12-25: qty 5

## 2020-12-25 MED ORDER — CEFAZOLIN SODIUM-DEXTROSE 2-4 GM/100ML-% IV SOLN
2.0000 g | INTRAVENOUS | Status: AC
  Administered 2020-12-25: 2 g via INTRAVENOUS

## 2020-12-25 MED ORDER — PROPOFOL 10 MG/ML IV BOLUS
INTRAVENOUS | Status: AC
  Filled 2020-12-25: qty 20

## 2020-12-25 MED ORDER — LACTATED RINGERS IV SOLN: INTRAVENOUS | Status: DC

## 2020-12-25 MED ORDER — LIDOCAINE HCL (PF) 1 % IJ SOLN
INTRAMUSCULAR | Status: AC
  Filled 2020-12-25: qty 5

## 2020-12-25 MED ORDER — OXYCODONE HCL 5 MG/5ML PO SOLN
5.0000 mg | Freq: Once | ORAL | Status: DC | PRN
Start: 2020-12-25 — End: 2020-12-25

## 2020-12-25 MED ORDER — CEFAZOLIN SODIUM-DEXTROSE 2-4 GM/100ML-% IV SOLN
INTRAVENOUS | Status: AC
  Filled 2020-12-25: qty 100

## 2020-12-25 MED ORDER — MIDAZOLAM HCL 5 MG/5ML IJ SOLN
INTRAMUSCULAR | Status: DC | PRN
  Administered 2020-12-25: 2 mg via INTRAVENOUS

## 2020-12-25 MED ORDER — MIDAZOLAM HCL 2 MG/2ML IJ SOLN
INTRAMUSCULAR | Status: AC
  Filled 2020-12-25: qty 2

## 2020-12-25 MED ORDER — BUPIVACAINE HCL (PF) 0.25 % IJ SOLN
INTRAMUSCULAR | Status: DC | PRN
  Administered 2020-12-25: 6 mL

## 2020-12-25 MED ORDER — ACETAMINOPHEN 160 MG/5ML PO SOLN: 325.0000 mg | ORAL | Status: DC | PRN

## 2020-12-25 MED ORDER — OXYCODONE HCL 5 MG PO TABS: 5.0000 mg | ORAL_TABLET | Freq: Once | ORAL | Status: DC | PRN

## 2020-12-25 MED ORDER — ONDANSETRON HCL 4 MG/2ML IJ SOLN
INTRAMUSCULAR | Status: DC | PRN
  Administered 2020-12-25: 4 mg via INTRAVENOUS

## 2020-12-25 MED ORDER — FENTANYL CITRATE (PF) 100 MCG/2ML IJ SOLN
INTRAMUSCULAR | Status: DC | PRN
  Administered 2020-12-25: 100 ug via INTRAVENOUS

## 2020-12-25 MED ORDER — ONDANSETRON HCL 4 MG/2ML IJ SOLN
INTRAMUSCULAR | Status: AC
  Filled 2020-12-25: qty 2

## 2020-12-25 MED ORDER — BUPIVACAINE HCL (PF) 0.25 % IJ SOLN
INTRAMUSCULAR | Status: AC
  Filled 2020-12-25: qty 30

## 2020-12-25 MED ORDER — FENTANYL CITRATE (PF) 100 MCG/2ML IJ SOLN: 25.0000 ug | INTRAMUSCULAR | Status: DC | PRN

## 2020-12-25 MED ORDER — ACETAMINOPHEN 325 MG PO TABS: 325.0000 mg | ORAL_TABLET | ORAL | Status: DC | PRN

## 2020-12-25 MED ORDER — LIDOCAINE 2% (20 MG/ML) 5 ML SYRINGE
INTRAMUSCULAR | Status: AC
  Filled 2020-12-25: qty 5

## 2020-12-25 MED ORDER — PROPOFOL 10 MG/ML IV BOLUS
INTRAVENOUS | Status: DC | PRN
  Administered 2020-12-25: 200 mg via INTRAVENOUS

## 2020-12-25 MED ORDER — DEXAMETHASONE SODIUM PHOSPHATE 10 MG/ML IJ SOLN
INTRAMUSCULAR | Status: DC | PRN
  Administered 2020-12-25: 10 mg via INTRAVENOUS

## 2020-12-25 MED ORDER — FENTANYL CITRATE (PF) 100 MCG/2ML IJ SOLN
INTRAMUSCULAR | Status: AC
  Filled 2020-12-25: qty 2

## 2020-12-25 MED ORDER — ONDANSETRON HCL 4 MG/2ML IJ SOLN: 4.0000 mg | Freq: Once | INTRAMUSCULAR | Status: DC | PRN

## 2020-12-25 MED ORDER — LIDOCAINE HCL (CARDIAC) PF 100 MG/5ML IV SOSY
PREFILLED_SYRINGE | INTRAVENOUS | Status: DC | PRN
  Administered 2020-12-25: 50 mg via INTRAVENOUS

## 2020-12-25 SURGICAL SUPPLY — 48 items
BAND INSRT 18 STRL LF DISP RB (MISCELLANEOUS) ×2
BAND RUBBER #18 3X1/16 STRL (MISCELLANEOUS) ×4 IMPLANT
BLADE MINI RND TIP GREEN BEAV (BLADE) ×2 IMPLANT
BLADE SURG 15 STRL LF DISP TIS (BLADE) ×1 IMPLANT
BLADE SURG 15 STRL SS (BLADE) ×2
BNDG CMPR 9X4 STRL LF SNTH (GAUZE/BANDAGES/DRESSINGS) ×1
BNDG ELASTIC 3X5.8 VLCR STR LF (GAUZE/BANDAGES/DRESSINGS) ×2 IMPLANT
BNDG ESMARK 4X9 LF (GAUZE/BANDAGES/DRESSINGS) ×2 IMPLANT
BNDG PLASTER X FAST 3X3 WHT LF (CAST SUPPLIES) IMPLANT
BNDG PLSTR 9X3 FST ST WHT (CAST SUPPLIES)
BRUSH SCRUB EZ PLAIN DRY (MISCELLANEOUS) ×2 IMPLANT
CANISTER SUCT 1200ML W/VALVE (MISCELLANEOUS) ×2 IMPLANT
CORD BIPOLAR FORCEPS 12FT (ELECTRODE) ×2 IMPLANT
COVER BACK TABLE 60X90IN (DRAPES) ×2 IMPLANT
COVER MAYO STAND STRL (DRAPES) ×2 IMPLANT
COVER WAND RF STERILE (DRAPES) IMPLANT
CUFF TOURN SGL QUICK 18X4 (TOURNIQUET CUFF) IMPLANT
DECANTER SPIKE VIAL GLASS SM (MISCELLANEOUS) IMPLANT
DRAPE EXTREMITY T 121X128X90 (DISPOSABLE) ×2 IMPLANT
DRAPE IMP U-DRAPE 54X76 (DRAPES) ×2 IMPLANT
DRAPE SURG 17X23 STRL (DRAPES) ×2 IMPLANT
GAUZE 4X4 16PLY RFD (DISPOSABLE) IMPLANT
GAUZE SPONGE 4X4 12PLY STRL (GAUZE/BANDAGES/DRESSINGS) ×2 IMPLANT
GAUZE XEROFORM 1X8 LF (GAUZE/BANDAGES/DRESSINGS) ×2 IMPLANT
GLOVE ECLIPSE 7.0 STRL STRAW (GLOVE) ×2 IMPLANT
GLOVE SURG NEOP MICRO LF SZ7.5 (GLOVE) ×2 IMPLANT
GLOVE SURG SYN 7.5  E (GLOVE) ×2
GLOVE SURG SYN 7.5 E (GLOVE) ×1 IMPLANT
GLOVE SURG SYN 7.5 PF PI (GLOVE) ×1 IMPLANT
GLOVE SURG UNDER POLY LF SZ7 (GLOVE) ×2 IMPLANT
GOWN STRL REIN XL XLG (GOWN DISPOSABLE) ×2 IMPLANT
GOWN STRL REUS W/ TWL XL LVL3 (GOWN DISPOSABLE) ×2 IMPLANT
GOWN STRL REUS W/TWL XL LVL3 (GOWN DISPOSABLE) ×4
NDL HYPO 25X1 1.5 SAFETY (NEEDLE) IMPLANT
NEEDLE HYPO 25X1 1.5 SAFETY (NEEDLE) IMPLANT
NS IRRIG 1000ML POUR BTL (IV SOLUTION) ×2 IMPLANT
PACK BASIN DAY SURGERY FS (CUSTOM PROCEDURE TRAY) ×2 IMPLANT
PAD CAST 3X4 CTTN HI CHSV (CAST SUPPLIES) ×1 IMPLANT
PADDING CAST COTTON 3X4 STRL (CAST SUPPLIES) ×2
SHEET MEDIUM DRAPE 40X70 STRL (DRAPES) ×2 IMPLANT
STOCKINETTE 4X48 STRL (DRAPES) ×2 IMPLANT
SUT ETHILON 3 0 PS 1 (SUTURE) ×2 IMPLANT
SYR BULB EAR ULCER 3OZ GRN STR (SYRINGE) ×2 IMPLANT
SYR CONTROL 10ML LL (SYRINGE) IMPLANT
TOWEL GREEN STERILE FF (TOWEL DISPOSABLE) ×2 IMPLANT
TRAY DSU PREP LF (CUSTOM PROCEDURE TRAY) ×2 IMPLANT
TUBE CONNECTING 20X1/4 (TUBING) IMPLANT
UNDERPAD 30X36 HEAVY ABSORB (UNDERPADS AND DIAPERS) ×2 IMPLANT

## 2020-12-25 NOTE — Transfer of Care (Signed)
Immediate Anesthesia Transfer of Care Note  Patient: Miranda Welch  Procedure(s) Performed: RIGHT CARPAL TUNNEL RELEASE, LEFT CARPAL TUNNEL INJECTION (Right Wrist)  Patient Location: PACU  Anesthesia Type:General  Level of Consciousness: awake, alert , oriented, drowsy and patient cooperative  Airway & Oxygen Therapy: Patient Spontanous Breathing and Patient connected to face mask oxygen  Post-op Assessment: Report given to RN and Post -op Vital signs reviewed and stable  Post vital signs: Reviewed and stable  Last Vitals:  Vitals Value Taken Time  BP    Temp    Pulse 96 12/25/20 1507  Resp 16 12/25/20 1507  SpO2 98 % 12/25/20 1507  Vitals shown include unvalidated device data.  Last Pain:  Vitals:   12/25/20 1356  TempSrc: Oral  PainSc: 0-No pain      Patients Stated Pain Goal: 4 (12/25/20 1356)  Complications: No complications documented.

## 2020-12-25 NOTE — Op Note (Signed)
   Carpal tunnel op note  DATE OF SURGERY:12/25/2020  PREOPERATIVE DIAGNOSIS:  Bilateral carpal tunnel syndrome  POSTOPERATIVE DIAGNOSIS: same  PROCEDURE:  Right carpal tunnel release. CPT [phone] Left carpal tunnel injection  SURGEON: Surgeon(s): Tarry Kos, MD  ASSIST: Oneal Grout, PA-C; necessary for the timely completion of procedure and due to complexity of procedure.  ANESTHESIA:  General  TOURNIQUET TIME: less than 20 minutes  BLOOD LOSS: Minimal.  COMPLICATIONS: None.  PATHOLOGY: None.  INDICATIONS: The patient is a 40 y.o. -year-old female who presented with carpal tunnel syndrome failing nonsurgical management, indicated for surgical release.  DESCRIPTION OF PROCEDURE: The patient was identified in the preoperative holding area.  The operative site was marked by the surgeon and confirmed by the patient.  He was brought back to the operating room.  Anesthesia was induced by the anesthesia team.  A well padded nonsterile tourniquet was placed. The operative extremity was prepped and draped in standard sterile fashion.  A timeout was performed.  Preoperative antibiotics were given.   A palmar incision was made about 5 mm ulnar to the thenar crease.  The palmar aponeurosis was exposed and divided in line with the skin incision. The palmaris brevis was visualized and divided.  The distal edge of the transcarpal ligament was identified. A hemostat was inserted into the carpal tunnel to protect the median nerve and the flexor tendons. Then, the transverse carpal ligament was released under direct visualization. Proximally, a subcutaneous tunnel was made allowing a Sewell retractor to be placed. Then, the distal portion of the antebrachial fascia was released. Distally, all fibrous bands were released. The median nerve was visualized, and the fat pad was exposed. Following release, local infiltration with 0.25% of Sensorcaine was given. The tourniquet was deflated.  Hemostasis achieved.  Wound was irrigated and closed with 4-0 nylon sutures. Sterile dressing applied. A left carpal tunnel injection was performed under sterile conditions through the FCR sheath using 3 cc mixture of kenalog, 0.25% marcaine, and 1% lidocaine.  Bandaid was applied.  The patient was transferred to the recovery room in stable condition after all counts were correct.  POSTOPERATIVE PLAN: To start nerve gliding exercises as tolerated and no heavy lifting for four weeks.  Glee Arvin, M.D. [facility] 2:55 PM

## 2020-12-25 NOTE — Anesthesia Procedure Notes (Signed)
Procedure Name: LMA Insertion Date/Time: 12/25/2020 2:36 PM Performed by: Ronnette Hila, CRNA Pre-anesthesia Checklist: Patient identified, Emergency Drugs available, Suction available and Patient being monitored Patient Re-evaluated:Patient Re-evaluated prior to induction Oxygen Delivery Method: Circle system utilized Preoxygenation: Pre-oxygenation with 100% oxygen Induction Type: IV induction Ventilation: Mask ventilation without difficulty LMA: LMA inserted LMA Size: 4.0 Number of attempts: 1 Airway Equipment and Method: Bite block Placement Confirmation: positive ETCO2 Tube secured with: Tape Dental Injury: Teeth and Oropharynx as per pre-operative assessment

## 2020-12-25 NOTE — Discharge Instructions (Signed)
Post Anesthesia Home Care Instructions  Activity: Get plenty of rest for the remainder of the day. A responsible individual must stay with you for 24 hours following the procedure.  For the next 24 hours, DO NOT: -Drive a car -Advertising copywriter -Drink alcoholic beverages -Take any medication unless instructed by your physician -Make any legal decisions or sign important papers.  Meals: Start with liquid foods such as gelatin or soup. Progress to regular foods as tolerated. Avoid greasy, spicy, heavy foods. If nausea and/or vomiting occur, drink only clear liquids until the nausea and/or vomiting subsides. Call your physician if vomiting continues.  Special Instructions/Symptoms: Your throat may feel dry or sore from the anesthesia or the breathing tube placed in your throat during surgery. If this causes discomfort, gargle with warm salt water. The discomfort should disappear within 24 hours.  If you had a scopolamine patch placed behind your ear for the management of post- operative nausea and/or vomiting:  1. The medication in the patch is effective for 72 hours, after which it should be removed.  Wrap patch in a tissue and discard in the trash. Wash hands thoroughly with soap and water. 2. You may remove the patch earlier than 72 hours if you experience unpleasant side effects which may include dry mouth, dizziness or visual disturbances. 3. Avoid touching the patch. Wash your hands with soap and water after contact with the patch.         Postoperative instructions:  Weightbearing instructions: no lifting more than 10 lbs with the right hand for 4 weeks  Dressing instructions: Keep your dressing and/or splint clean and dry at all times.  It will be removed at your first post-operative appointment.  Your stitches and/or staples will be removed at this visit.  Incision instructions:  Do not soak your incision for 3 weeks after surgery.  If the incision gets wet, pat dry and do  not scrub the incision.  Pain control:  You have been given a prescription to be taken as directed for post-operative pain control.  In addition, elevate the operative extremity above the heart at all times to prevent swelling and throbbing pain.  Take over-the-counter Colace, 100mg  by mouth twice a day while taking narcotic pain medications to help prevent constipation.  Follow up appointments: 1) 7 days for wound check. 2) Dr. as scheduled.   -------------------------------------------------------------------------------------------------------------  After Surgery Pain Control:  After your surgery, post-surgical discomfort or pain is likely. This discomfort can last several days to a few weeks. At certain times of the day your discomfort may be more intense.  Did you receive a nerve block?  A nerve block can provide pain relief for one hour to two days after your surgery. As long as the nerve block is working, you will experience little or no sensation in the area the surgeon operated on.  As the nerve block wears off, you will begin to experience pain or discomfort. It is very important that you begin taking your prescribed pain medication before the nerve block fully wears off. Treating your pain at the first sign of the block wearing off will ensure your pain is better controlled and more tolerable when full-sensation returns. Do not wait until the pain is intolerable, as the medicine will be less effective. It is better to treat pain in advance than to try and catch up.  General Anesthesia:  If you did not receive a nerve block during your surgery, you will need to start taking your  pain medication shortly after your surgery and should continue to do so as prescribed by your surgeon.  Pain Medication:  Most commonly we prescribe Vicodin and Percocet for post-operative pain. Both of these medications contain a combination of acetaminophen (Tylenol) and a narcotic to help control pain.    It takes between 30 and 45 minutes before pain medication starts to work. It is important to take your medication before your pain level gets too intense.   Nausea is a common side effect of many pain medications. You will want to eat something before taking your pain medicine to help prevent nausea.   If you are taking a prescription pain medication that contains acetaminophen, we recommend that you do not take additional over the counter acetaminophen (Tylenol).  Other pain relieving options:   Using a cold pack to ice the affected area a few times a day (15 to 20 minutes at a time) can help to relieve pain, reduce swelling and bruising.   Elevation of the affected area can also help to reduce pain and swelling.

## 2020-12-25 NOTE — H&P (Signed)
    PREOPERATIVE H&P  Chief Complaint: bilateral carpal tunnel syndrome  HPI: Miranda Welch is a 40 y.o. female who presents for surgical treatment of bilateral carpal tunnel syndrome.  She denies any changes in medical history.  Past Medical History:  Diagnosis Date  . Gestational diabetes   . Thickened endometrium 08/17/2019   Past Surgical History:  Procedure Laterality Date  . NO PAST SURGERIES     Social History   Socioeconomic History  . Marital status: Married    Spouse name: Not on file  . Number of children: 3  . Years of education: 51  . Highest education level: 12th grade  Occupational History  . Not on file  Tobacco Use  . Smoking status: Never Smoker  . Smokeless tobacco: Never Used  Vaping Use  . Vaping Use: Never used  Substance and Sexual Activity  . Alcohol use: No  . Drug use: No  . Sexual activity: Yes    Birth control/protection: Pill  Other Topics Concern  . Not on file  Social History Narrative   Moved from [place] 10 yrs ago, lives with husband and 2 daughters (2011, 2004). Stay at home mother. Walks for exercise irregularly.   Social Determinants of Health   Financial Resource Strain: Not on file  Food Insecurity: Not on file  Transportation Needs: No Transportation Needs  . Lack of Transportation (Medical): No  . Lack of Transportation (Non-Medical): No  Physical Activity: Not on file  Stress: Not on file  Social Connections: Not on file   Family History  Problem Relation Age of Onset  . Diabetes Mother   . Hypertension Mother   . Stroke Mother   . Diabetes Father   . Diabetes Sister   . Diabetes Maternal Grandmother   . Diabetes Maternal Grandfather    No Known Allergies Prior to Admission medications   Medication Sig Start Date End Date Taking? Authorizing Provider  ferrous sulfate (FERROUSUL) 325 (65 FE) MG tablet Take 1 tablet (325 mg total) by mouth 2 (two) times daily with a meal. 12/19/20   Mayers, Cari S, PA-C      Positive ROS: All other systems have been reviewed and were otherwise negative with the exception of those mentioned in the HPI and as above.  Physical Exam: General: Alert, no acute distress Cardiovascular: No pedal edema Respiratory: No cyanosis, no use of accessory musculature GI: abdomen soft Skin: No lesions in the area of chief complaint Neurologic: Sensation intact distally Psychiatric: Patient is competent for consent with normal mood and affect Lymphatic: no lymphedema  MUSCULOSKELETAL: exam stable  Assessment: bilateral carpal tunnel syndrome  Plan: Plan for Procedure(s): RIGHT CARPAL TUNNEL RELEASE, LEFT CARPAL TUNNEL INJECTION  The risks benefits and alternatives were discussed with the patient including but not limited to the risks of nonoperative treatment, versus surgical intervention including infection, bleeding, nerve injury,  blood clots, cardiopulmonary complications, morbidity, mortality, among others, and they were willing to proceed.   Preoperative templating of the joint replacement has been completed, documented, and submitted to the Operating Room personnel in order to optimize intra-operative equipment management.   Glee Arvin, MD 12/25/2020 1:40 PM

## 2020-12-26 ENCOUNTER — Encounter (HOSPITAL_BASED_OUTPATIENT_CLINIC_OR_DEPARTMENT_OTHER): Payer: Self-pay | Admitting: Orthopaedic Surgery

## 2020-12-26 ENCOUNTER — Other Ambulatory Visit: Payer: Self-pay | Admitting: Orthopaedic Surgery

## 2020-12-26 MED ORDER — HYDROCODONE-ACETAMINOPHEN 5-325 MG PO TABS: 1.0000 | ORAL_TABLET | Freq: Four times a day (QID) | ORAL | 0 refills | Status: DC | PRN

## 2020-12-26 NOTE — Anesthesia Preprocedure Evaluation (Signed)
Anesthesia Evaluation  Patient identified by MRN, date of birth, ID band Patient awake    Reviewed: Allergy & Precautions, H&P , NPO status , Patient's Chart, lab work & pertinent test results  Airway Mallampati: II  TM Distance: >3 FB Neck ROM: Full    Dental no notable dental hx.    Pulmonary neg pulmonary ROS,    Pulmonary exam normal breath sounds clear to auscultation       Cardiovascular negative cardio ROS Normal cardiovascular exam Rhythm:Regular Rate:Normal     Neuro/Psych negative neurological ROS  negative psych ROS   GI/Hepatic negative GI ROS, Neg liver ROS,   Endo/Other  negative endocrine ROSdiabetes  Renal/GU negative Renal ROS  negative genitourinary   Musculoskeletal negative musculoskeletal ROS (+)   Abdominal   Peds negative pediatric ROS (+)  Hematology negative hematology ROS (+)   Anesthesia Other Findings   Reproductive/Obstetrics negative OB ROS                             Anesthesia Physical Anesthesia Plan  ASA: II  Anesthesia Plan: General   Post-op Pain Management:    Induction: Intravenous  PONV Risk Score and Plan:   Airway Management Planned: LMA  Additional Equipment: None  Intra-op Plan:   Post-operative Plan: Extubation in OR  Informed Consent:   Plan Discussed with: CRNA, Surgeon and Anesthesiologist  Anesthesia Plan Comments: ( )        Anesthesia Quick Evaluation

## 2020-12-26 NOTE — Anesthesia Postprocedure Evaluation (Signed)
Anesthesia Post Note  Patient: Miranda Welch  Procedure(s) Performed: RIGHT CARPAL TUNNEL RELEASE, LEFT CARPAL TUNNEL INJECTION (Right Wrist)     Patient location during evaluation: PACU Anesthesia Type: General Level of consciousness: awake and alert Pain management: pain level controlled Vital Signs Assessment: post-procedure vital signs reviewed and stable Respiratory status: spontaneous breathing, nonlabored ventilation, respiratory function stable and patient connected to nasal cannula oxygen Cardiovascular status: blood pressure returned to baseline and stable Postop Assessment: no apparent nausea or vomiting Anesthetic complications: no   No complications documented.  Last Vitals:  Vitals:   12/25/20 1519 12/25/20 1547  BP:  118/74  Pulse: 79 70  Resp: 15 20  Temp:  36.8 C  SpO2: 97% 98%    Last Pain:  Vitals:   12/25/20 1547  TempSrc: Oral  PainSc: 0-No pain                 Saphia Vanderford

## 2021-01-01 ENCOUNTER — Other Ambulatory Visit: Payer: Self-pay

## 2021-01-01 ENCOUNTER — Encounter: Payer: Self-pay | Admitting: Orthopaedic Surgery

## 2021-01-01 ENCOUNTER — Ambulatory Visit (INDEPENDENT_AMBULATORY_CARE_PROVIDER_SITE_OTHER): Payer: Self-pay | Admitting: Orthopaedic Surgery

## 2021-01-01 DIAGNOSIS — G5601 Carpal tunnel syndrome, right upper limb: Secondary | ICD-10-CM

## 2021-01-01 NOTE — Progress Notes (Signed)
   Post-Op Visit Note   Patient: Miranda Welch           Date of Birth: 1981/11/04           MRN: [id number] Visit Date: 01/01/2021 PCP: Marcine Matar, MD   Assessment & Plan:  Chief Complaint:  Chief Complaint  Patient presents with  . Right Hand - Pain, Routine Post Op  . Left Hand - Follow-up, Pain    INJECTION GIVEN 12/25/2020   Visit Diagnoses:  1. Right carpal tunnel syndrome     Plan:   Miranda Welch is 1 week status post right carpal tunnel release and left carpal tunnel injection.  Overall doing well and reports no pain.  She is sleeping much better.  Mainly just notices some swelling in the hand and decreased arc of motion of the fingers as a result.  Overall very satisfied.  Right hand shows intact sutures and intact incision.  No signs of infection.  Minimal swelling.  No neurovascular compromise.  Band-Aid applied today.  We will place her in a removable carpal tunnel brace which she will wear for at least another week.  Follow-up next week for suture removal.  Follow-Up Instructions: Return in about 1 week (around 01/08/2021).   Orders:  No orders of the defined types were placed in this encounter.  No orders of the defined types were placed in this encounter.   Imaging: No results found.  PMFS History: Patient Active Problem List   Diagnosis Date Noted  . Carpal tunnel syndrome on right   . Carpal tunnel syndrome on left   . Vitamin D deficiency 12/19/2020  . Prediabetes 12/19/2020  . Fatigue 12/19/2020  . Anemia 12/19/2020  . Abnormal uterine bleeding (AUB) 06/15/2019  . Shoulder pain, right 05/06/2012  . Obesity 05/06/2012  . PALPITATIONS 02/10/2007   Past Medical History:  Diagnosis Date  . Gestational diabetes   . Thickened endometrium 08/17/2019    Family History  Problem Relation Age of Onset  . Diabetes Mother   . Hypertension Mother   . Stroke Mother   . Diabetes Father   . Diabetes Sister   . Diabetes Maternal Grandmother   .  Diabetes Maternal Grandfather     Past Surgical History:  Procedure Laterality Date  . CARPAL TUNNEL RELEASE Right 12/25/2020   Procedure: RIGHT CARPAL TUNNEL RELEASE, LEFT CARPAL TUNNEL INJECTION;  Surgeon: Tarry Kos, MD;  Location: [facility];  Service: Orthopedics;  Laterality: Right;  . NO PAST SURGERIES     Social History   Occupational History  . Not on file  Tobacco Use  . Smoking status: Never Smoker  . Smokeless tobacco: Never Used  Vaping Use  . Vaping Use: Never used  Substance and Sexual Activity  . Alcohol use: No  . Drug use: No  . Sexual activity: Yes    Birth control/protection: Pill

## 2021-01-08 ENCOUNTER — Encounter: Payer: Self-pay | Admitting: Orthopaedic Surgery

## 2021-01-08 ENCOUNTER — Ambulatory Visit (INDEPENDENT_AMBULATORY_CARE_PROVIDER_SITE_OTHER): Payer: Self-pay | Admitting: Physician Assistant

## 2021-01-08 DIAGNOSIS — G5601 Carpal tunnel syndrome, right upper limb: Secondary | ICD-10-CM

## 2021-01-08 DIAGNOSIS — Z9889 Other specified postprocedural states: Secondary | ICD-10-CM

## 2021-01-08 NOTE — Progress Notes (Signed)
   Post-Op Visit Note   Patient: Miranda Welch           Date of Birth: 1981-03-27           MRN: [id number] Visit Date: 01/08/2021 PCP: Marcine Matar, MD   Assessment & Plan:  Chief Complaint:  Chief Complaint  Patient presents with  . Right Hand - Follow-up   Visit Diagnoses:  1. Right carpal tunnel syndrome   2. S/P carpal tunnel release     Plan: Patient is a pleasant 40 year old female who comes in today 2 weeks out right carpal tunnel release.  She has been doing well.  Minimal pain.  No paresthesias.  Examination of her right wrist reveals a well-healing surgical incision with nylon sutures in place.  No evidence of infection or cellulitis.  Is warm and well-perfused.  She is neurovascular intact distally.  Today, sutures were removed and Steri-Strips applied.  She may wear her wrist splint for comfort.  Start range of motion exercises.  No heavy lifting or submerging her hand in water for the next 2 weeks.  Follow-up with [place] in 4 weeks time for recheck.  Call with concerns or questions.  This was all discussed through Spanish-speaking interpreter.  Follow-Up Instructions: Return in about 4 weeks (around 02/05/2021).   Orders:  No orders of the defined types were placed in this encounter.  No orders of the defined types were placed in this encounter.   Imaging: No new imaging  PMFS History: Patient Active Problem List   Diagnosis Date Noted  . Carpal tunnel syndrome on right   . Carpal tunnel syndrome on left   . Vitamin D deficiency 12/19/2020  . Prediabetes 12/19/2020  . Fatigue 12/19/2020  . Anemia 12/19/2020  . Abnormal uterine bleeding (AUB) 06/15/2019  . Shoulder pain, right 05/06/2012  . Obesity 05/06/2012  . PALPITATIONS 02/10/2007   Past Medical History:  Diagnosis Date  . Gestational diabetes   . Thickened endometrium 08/17/2019    Family History  Problem Relation Age of Onset  . Diabetes Mother   . Hypertension Mother   . Stroke  Mother   . Diabetes Father   . Diabetes Sister   . Diabetes Maternal Grandmother   . Diabetes Maternal Grandfather     Past Surgical History:  Procedure Laterality Date  . CARPAL TUNNEL RELEASE Right 12/25/2020   Procedure: RIGHT CARPAL TUNNEL RELEASE, LEFT CARPAL TUNNEL INJECTION;  Surgeon: Tarry Kos, MD;  Location: [facility];  Service: Orthopedics;  Laterality: Right;  . NO PAST SURGERIES     Social History   Occupational History  . Not on file  Tobacco Use  . Smoking status: Never Smoker  . Smokeless tobacco: Never Used  Vaping Use  . Vaping Use: Never used  Substance and Sexual Activity  . Alcohol use: No  . Drug use: No  . Sexual activity: Yes    Birth control/protection: Pill

## 2021-01-09 ENCOUNTER — Ambulatory Visit: Payer: Self-pay

## 2021-01-09 ENCOUNTER — Ambulatory Visit
Admission: RE | Admit: 2021-01-09 | Discharge: 2021-01-09 | Disposition: A | Discharge status: Home / Self Care | Payer: No Typology Code available for payment source | Source: Ambulatory Visit | Attending: Obstetrics and Gynecology | Admitting: Obstetrics and Gynecology

## 2021-01-09 ENCOUNTER — Ambulatory Visit: Payer: Self-pay | Admitting: *Deleted

## 2021-01-09 ENCOUNTER — Other Ambulatory Visit: Payer: Self-pay

## 2021-01-09 VITALS — BP 132/74 | Wt 174.5 lb

## 2021-01-09 DIAGNOSIS — Z1239 Encounter for other screening for malignant neoplasm of breast: Secondary | ICD-10-CM

## 2021-01-09 DIAGNOSIS — N644 Mastodynia: Secondary | ICD-10-CM

## 2021-01-09 NOTE — Patient Instructions (Signed)
Explained breast self awareness with Nigel Bridgeman. Patient did not need a Pap smear today due to last Pap smear was 02/06/2019. Let her know BCCCP will cover Pap smears every 3 years unless has a history of abnormal Pap smears. Referred patient to the [facility] for a diagnostic mammogram. Appointment scheduled Thursday, January 09, 2021 at 0900. Patient aware of appointment and will be there. Miranda Welch verbalized understanding.  Jennie Bolar, Kathaleen Maser, RN 8:11 AM

## 2021-01-09 NOTE — Progress Notes (Signed)
[name] is a 40 y.o. female who presents to [facility] clinic today with complaint of bilateral breast pain. Patient stated the left pain started in November 2021 and the right x one month. Patient stated the pain comes and goes. Patient stated the pain is diffuse. Patient rates the pain at a 7 out of 10. Patient denied any pain today.   Pap Smear: Pap smear not completed today. Last Pap smear was 02/06/2019 at [facility]. And was normal. Per patient has no history of an abnormal Pap smear. Last Pap smear resultis available in Epic.   Physical exam: Breasts Breasts symmetrical. No skin abnormalities bilateral breasts. No nipple retraction bilateral breasts. No nipple discharge bilateral breasts. No lymphadenopathy. No lumps palpated bilateral breasts. No complaints of pain or tenderness on exam.       Pelvic/Bimanual Pap is not indicated today per BCCCP guidelines.   Smoking History: Patient has never smoked.   Patient Navigation: Patient education provided. Access to services provided for patient through [facility] program. Spanish interpreter Natale Lay from [facility] provided.    Breast and Cervical Cancer Risk Assessment: Patient has family history of maternal first cousin having breast cancer. Patient has no known genetic mutations or history of radiation treatment to the chest before age 44. Patient does not have history of cervical dysplasia, immunocompromised, or DES exposure in-utero.  Risk Assessment    Risk Scores      01/09/2021 02/15/2020   Last edited by: Narda Rutherford, LPN McGill, Sherie Demetrius Charity, LPN   5-year risk: 0.4 % 0.3 %   Lifetime risk: 6.3 % 6.4 %         A: BCCCP exam without pap smear Complaint of bilateral breast pain.  P: Referred patient to the [facility] for a diagnostic mammogram. Appointment scheduled Thursday, January 09, 2021 at 0900.  Priscille Heidelberg, RN 01/09/2021 8:11 AM

## 2021-01-11 ENCOUNTER — Encounter: Payer: Self-pay | Admitting: Orthopaedic Surgery

## 2021-01-24 ENCOUNTER — Encounter: Payer: Self-pay | Admitting: Orthopaedic Surgery

## 2021-01-30 ENCOUNTER — Encounter: Payer: Self-pay | Admitting: Internal Medicine

## 2021-01-30 ENCOUNTER — Ambulatory Visit: Payer: Self-pay | Attending: Internal Medicine | Admitting: Internal Medicine

## 2021-01-30 ENCOUNTER — Other Ambulatory Visit: Payer: Self-pay

## 2021-01-30 VITALS — BP 116/85 | HR 71 | Resp 16 | Wt 177.8 lb

## 2021-01-30 DIAGNOSIS — D500 Iron deficiency anemia secondary to blood loss (chronic): Secondary | ICD-10-CM

## 2021-01-30 DIAGNOSIS — L309 Dermatitis, unspecified: Secondary | ICD-10-CM

## 2021-01-30 DIAGNOSIS — E559 Vitamin D deficiency, unspecified: Secondary | ICD-10-CM

## 2021-01-30 DIAGNOSIS — Z2821 Immunization not carried out because of patient refusal: Secondary | ICD-10-CM

## 2021-01-30 NOTE — Progress Notes (Signed)
Pt is requesting to have Vit D and iron levels rechecked

## 2021-01-30 NOTE — Progress Notes (Signed)
Patient ID: Miranda Welch, female    DOB: Nov 14, 1981  MRN: [id number]  CC: blood work   Subjective: Miranda Welch is a 40 y.o. female who presents for recheck anemia and vit D level Her concerns today include:  Hx of obesity, Vit D def, IDA, preDM  Saw PA Meyers 6 wks ago.with c/o fatigue. Found to have IDA.  Started on iron supplement which she has been taking BID.  Endorses heavy menses that last 5 days; heaviest last 3 days.   Pt also request recheck Vit D level.  Taking 2000 mcg daily  Has spot on LT hand x 25 yrs.  Increased over the past 6-7 mths.    HM: Declines flu shot.  Reports having completed 2 shots of the COVID-19 vaccine.  She does not have her card with her today. Patient Active Problem List   Diagnosis Date Noted  . Influenza vaccine refused 01/30/2021  . Carpal tunnel syndrome on right   . Carpal tunnel syndrome on left   . Vitamin D deficiency 12/19/2020  . Prediabetes 12/19/2020  . Fatigue 12/19/2020  . Anemia 12/19/2020  . Abnormal uterine bleeding (AUB) 06/15/2019  . Shoulder pain, right 05/06/2012  . Obesity 05/06/2012  . PALPITATIONS 02/10/2007     Current Outpatient Medications on File Prior to Visit  Medication Sig Dispense Refill  . cholecalciferol (VITAMIN D3) 25 MCG (1000 UNIT) tablet Take 2,000 Units by mouth daily.    . ferrous sulfate (FERROUSUL) 325 (65 FE) MG tablet Take 1 tablet (325 mg total) by mouth 2 (two) times daily with a meal. 60 tablet 0   No current facility-administered medications on file prior to visit.    No Known Allergies  Social History   Socioeconomic History  . Marital status: Married    Spouse name: Not on file  . Number of children: 3  . Years of education: 61  . Highest education level: High school graduate  Occupational History  . Not on file  Tobacco Use  . Smoking status: Never Smoker  . Smokeless tobacco: Never Used  Vaping Use  . Vaping Use: Never used  Substance and Sexual Activity   . Alcohol use: Yes    Comment: occasionally  . Drug use: No  . Sexual activity: Not Currently    Birth control/protection: None  Other Topics Concern  . Not on file  Social History Narrative   Moved from [place] 10 yrs ago, lives with husband and 2 daughters (2011, 2004). Stay at home mother. Walks for exercise irregularly.   Social Determinants of Health   Financial Resource Strain: Not on file  Food Insecurity: Not on file  Transportation Needs: No Transportation Needs  . Lack of Transportation (Medical): No  . Lack of Transportation (Non-Medical): No  Physical Activity: Not on file  Stress: Not on file  Social Connections: Not on file  Intimate Partner Violence: Not on file    Family History  Problem Relation Age of Onset  . Diabetes Mother   . Hypertension Mother   . Stroke Mother   . Diabetes Father   . Diabetes Sister   . Diabetes Maternal Grandmother   . Diabetes Maternal Grandfather     Past Surgical History:  Procedure Laterality Date  . CARPAL TUNNEL RELEASE Right 12/25/2020   Procedure: RIGHT CARPAL TUNNEL RELEASE, LEFT CARPAL TUNNEL INJECTION;  Surgeon: Tarry Kos, MD;  Location: [facility];  Service: Orthopedics;  Laterality: Right;  . NO PAST SURGERIES  ROS: Review of Systems Negative except as stated above  PHYSICAL EXAM: BP 116/85   Pulse 71   Resp 16   Wt 177 lb 12.8 oz (80.6 kg)   LMP 01/01/2021 (Exact Date)   SpO2 96%   BMI 34.72 kg/m   Physical Exam  General appearance - alert, well appearing, young to middle-aged Hispanic female and in no distress Mental status - normal mood, behavior, speech, dress, motor activity, and thought processes Eyes - pupils equal and reactive, extraocular eye movements intact Chest - clear to auscultation, no wheezes, rales or rhonchi, symmetric air entry Heart - normal rate, regular rhythm, normal S1, S2, no murmurs, rubs, clicks or gallops Skin -macular hyperpigmented discoloration  noted on the dorsal surface of the left wrist   CMP Latest Ref Rng & Units 12/28/2019 10/18/2019 05/08/2017  Glucose 65 - 99 mg/dL 83 606(T) 92  BUN 6 - 20 mg/dL 17 17 01(S)  Creatinine 0.57 - 1.00 mg/dL 0.10 9.32 3.55  Sodium 134 - 144 mmol/L 138 138 141  Potassium 3.5 - 5.2 mmol/L 4.8 4.3 4.5  Chloride 96 - 106 mmol/L 103 102 103  CO2 20 - 29 mmol/L 20 22 23   Calcium 8.7 - 10.2 mg/dL 9.2 9.7 9.3  Total Protein 6.0 - 8.5 g/dL 7.1 7.1 6.9  Total Bilirubin 0.0 - 1.2 mg/dL <7.3 0.4  Alkaline Phos 39 - 117 IU/L 53 66 56  AST 0 - 40 IU/L 15 15 20   ALT 0 - 32 IU/L 11 13 21    Lipid Panel     Component Value Date/Time   CHOL 162 10/18/2019 1421   TRIG 268 (H) 10/18/2019 1421   HDL 43 10/18/2019 1421   CHOLHDL 3.8 10/18/2019 1421   LDLCALC 75 10/18/2019 1421    CBC    Component Value Date/Time   WBC 8.3 12/18/2020 1554   WBC 8.3 01/31/2014 0555   RBC 5.19 12/18/2020 1554   RBC 3.67 (L) 01/31/2014 0555   HGB 9.5 (L) 12/18/2020 1554   HCT 33.9 (L) 12/18/2020 1554   PLT 299 12/18/2020 1554   MCV 65 (L) 12/18/2020 1554   MCH 18.3 (L) 12/18/2020 1554   MCH 26.2 01/31/2014 0555   MCHC 28.0 (L) 12/18/2020 1554   MCHC 32.8 01/31/2014 0555   RDW 20.5 (H) 12/18/2020 1554   LYMPHSABS 3.1 12/18/2020 1554   EOSABS 0.1 12/18/2020 1554   BASOSABS 0.0 12/18/2020 1554    ASSESSMENT AND PLAN:  1. Iron deficiency anemia due to chronic blood loss Likely due to heavy menstrual cycles.  We will recheck blood count and iron levels today.  Continue iron supplement for now. - Iron, TIBC and Ferritin Panel - CBC with Differential/Platelet  2. Vitamin D deficiency Continue vitamin D supplement.  We will check vitamin D level - Vitamin D, 25-hydroxy  3. Influenza vaccine refused Patient declined  4. Dermatitis - Ambulatory referral to Dermatology    Patient was given the opportunity to ask questions.  Patient verbalized understanding of the plan and was able to repeat key elements  of the plan.  [employer] interpreter used during this encounter. 02/15/2021. 02/15/2021  Orders Placed This Encounter  Procedures  . Vitamin D, 25-hydroxy  . Iron, TIBC and Ferritin Panel  . CBC with Differential/Platelet     Requested Prescriptions    No prescriptions requested or ordered in this encounter    No follow-ups on file.  02/15/2021, MD, FACP

## 2021-01-31 LAB — CBC WITH DIFFERENTIAL/PLATELET
Basophils Absolute: 0 10*3/uL (ref 0.0–0.2)
Basos: 0 %
EOS (ABSOLUTE): 0.1 10*3/uL (ref 0.0–0.4)
Eos: 1 %
Hematocrit: 41 % (ref 34.0–46.6)
Hemoglobin: 12.9 g/dL (ref 11.1–15.9)
Immature Grans (Abs): 0 10*3/uL (ref 0.0–0.1)
Immature Granulocytes: 0 %
Lymphocytes Absolute: 3.1 10*3/uL (ref 0.7–3.1)
Lymphs: 36 %
MCH: 24 pg — ABNORMAL LOW (ref 26.6–33.0)
MCHC: 31.5 g/dL (ref 31.5–35.7)
MCV: 76 fL — ABNORMAL LOW (ref 79–97)
Monocytes Absolute: 0.4 10*3/uL (ref 0.1–0.9)
Monocytes: 4 %
Neutrophils Absolute: 5.1 10*3/uL (ref 1.4–7.0)
Neutrophils: 59 %
Platelets: 259 10*3/uL (ref 150–450)
RBC: 5.37 x10E6/uL — ABNORMAL HIGH (ref 3.77–5.28)
WBC: 8.6 10*3/uL (ref 3.4–10.8)

## 2021-01-31 LAB — VITAMIN D 25 HYDROXY (VIT D DEFICIENCY, FRACTURES): Vit D, 25-Hydroxy: 35.9 ng/mL (ref 30.0–100.0)

## 2021-01-31 LAB — IRON,TIBC AND FERRITIN PANEL
Ferritin: 43 ng/mL (ref 15–150)
Iron Saturation: 6 % — CL (ref 15–55)
Iron: 32 ug/dL (ref 27–159)
Total Iron Binding Capacity: 558 ug/dL (ref 250–450)
UIBC: 526 ug/dL — ABNORMAL HIGH (ref 131–425)

## 2021-02-05 ENCOUNTER — Ambulatory Visit (INDEPENDENT_AMBULATORY_CARE_PROVIDER_SITE_OTHER): Payer: Self-pay | Admitting: Physician Assistant

## 2021-02-05 DIAGNOSIS — Z9889 Other specified postprocedural states: Secondary | ICD-10-CM

## 2021-02-05 NOTE — Progress Notes (Signed)
   Post-Op Visit Note   Patient: Miranda Welch           Date of Birth: [birth year]           MRN: [id number] Visit Date: 02/05/2021 PCP: Marcine Matar, MD   Assessment & Plan:  Chief Complaint:  Chief Complaint  Patient presents with  . Right Wrist - Follow-up   Visit Diagnoses:  1. S/P carpal tunnel release     Plan: Patient is a pleasant 40 year old Spanish-speaking female who comes in today 6 weeks out right carpal tunnel release.  She has been doing well.  No pain but she does note some paresthesias around the incision.  No paresthesias in the fingers.  Examination of her right hand reveals a well healed surgical incision without complication.  She does have some dry skin surrounding the incision but no signs of infection.  She has full sensation distally.  At this point, she may continue to advance with activity as tolerated.  She will follow up with [place] as needed for the right hand but will follow up with [place] at some point in near future to schedule a left carpal tunnel release.  Call with concerns or questions in the meantime.  Follow-Up Instructions: Return if symptoms worsen or fail to improve.   Orders:  No orders of the defined types were placed in this encounter.  No orders of the defined types were placed in this encounter.   Imaging: No new imaging  PMFS History: Patient Active Problem List   Diagnosis Date Noted  . Influenza vaccine refused 01/30/2021  . Carpal tunnel syndrome on right   . Carpal tunnel syndrome on left   . Vitamin D deficiency 12/19/2020  . Prediabetes 12/19/2020  . Fatigue 12/19/2020  . Anemia 12/19/2020  . Abnormal uterine bleeding (AUB) 06/15/2019  . Shoulder pain, right 05/06/2012  . Obesity 05/06/2012  . PALPITATIONS 02/10/2007   Past Medical History:  Diagnosis Date  . Gestational diabetes   . Thickened endometrium 08/17/2019    Family History  Problem Relation Age of Onset  . Diabetes Mother   . Hypertension Mother    . Stroke Mother   . Diabetes Father   . Diabetes Sister   . Diabetes Maternal Grandmother   . Diabetes Maternal Grandfather     Past Surgical History:  Procedure Laterality Date  . CARPAL TUNNEL RELEASE Right 12/25/2020   Procedure: RIGHT CARPAL TUNNEL RELEASE, LEFT CARPAL TUNNEL INJECTION;  Surgeon: Tarry Kos, MD;  Location: [facility];  Service: Orthopedics;  Laterality: Right;  . NO PAST SURGERIES     Social History   Occupational History  . Not on file  Tobacco Use  . Smoking status: Never Smoker  . Smokeless tobacco: Never Used  Vaping Use  . Vaping Use: Never used  Substance and Sexual Activity  . Alcohol use: Yes    Comment: occasionally  . Drug use: No  . Sexual activity: Not Currently    Birth control/protection: None

## 2021-02-07 ENCOUNTER — Other Ambulatory Visit: Payer: Self-pay

## 2021-02-07 ENCOUNTER — Encounter: Payer: Self-pay | Admitting: Internal Medicine

## 2021-02-07 DIAGNOSIS — D649 Anemia, unspecified: Secondary | ICD-10-CM

## 2021-02-07 MED ORDER — FERROUS SULFATE 325 (65 FE) MG PO TABS: 325.0000 mg | ORAL_TABLET | Freq: Two times a day (BID) | ORAL | 3 refills | Status: DC

## 2021-02-25 ENCOUNTER — Other Ambulatory Visit: Payer: Self-pay

## 2021-02-25 ENCOUNTER — Ambulatory Visit: Payer: Self-pay | Admitting: Physician Assistant

## 2021-02-25 VITALS — BP 127/85 | HR 70 | Temp 98.2°F | Resp 18 | Ht 60.0 in | Wt 178.0 lb

## 2021-02-25 DIAGNOSIS — R1013 Epigastric pain: Secondary | ICD-10-CM | POA: Insufficient documentation

## 2021-02-25 DIAGNOSIS — K219 Gastro-esophageal reflux disease without esophagitis: Secondary | ICD-10-CM

## 2021-02-25 MED ORDER — PANTOPRAZOLE SODIUM 40 MG PO TBEC: 40.0000 mg | DELAYED_RELEASE_TABLET | Freq: Every day | ORAL | 3 refills | Status: AC

## 2021-02-25 NOTE — Patient Instructions (Signed)
For your stomach pain, you will take Protonix once daily.  I encourage you to take it first thing in the morning.  Make sure that you are eating a gentle diet over the next couple of weeks.  We will call you with your lab results.  I hope that you feel better soon, please let us know if there is anything else we can do for you.  Roney Jaffe, PA-C Physician Assistant [facility] [email or web address]    Conn's Current Therapy 2021 (pp. 213-216). [place], [place]: Elsevier.">  Enfermedad de reflujo gastroesofgico en los adultos Gastroesophageal Reflux Disease, Adult El reflujo gastroesofgico (RGE) ocurre cuando el cido del estmago sube por el tubo que conecta la boca con el estmago (esfago). Normalmente, la comida baja por el esfago y se mantiene en el [address], donde se la digiere. Sin embargo, cuando una persona tiene [place], los alimentos y el cido estomacal suelen volver al esfago. Si esto se vuelve un problema ms grave, a la persona se le puede diagnosticar una enfermedad llamada enfermedad de reflujo gastroesofgico (ERGE). La ERGE ocurre cuando el reflujo:  Sucede a menudo.  Causa sntomas frecuentes o graves.  Causa problemas tales como dao en el esfago. Cuando el cido del Proofreader en contacto con el esfago, el cido puede provocar inflamacin en el esfago. Con el tiempo, pueden formarse pequeos agujeros (lceras) en el revestimiento del esfago. Cules son las causas? Esta afeccin se debe a un problema en el msculo que se encuentra entre el esfago y Investment banker, corporate (esfnter esofgico inferior, o EEI). Normalmente, el EEI se cierra una vez que la comida pasa a travs del esfago hasta el [place]. Cuando el EEI se encuentra debilitado o tiene alguna anomala, no se cierra por completo, y eso permite que tanto la comida como el jugo gstrico, que es cido, [place] a subir por el esfago. El EEI puede  debilitarse a causa de ciertas sustancias alimenticias, medicamentos y Pharmacist, community, que incluyen:  El consumo de [place].  [place].  Tener una hernia de hiato.  Consumo de alcohol.  Ciertos alimentos y bebidas, como caf, chocolate, cebollas y [place]. Qu incrementa el riesgo? Es ms probable que tenga esta afeccin si:  Tiene un aumento del Runner, broadcasting/film/video.  Tiene un trastorno del tejido conjuntivo.  Toma antiinflamatorios no esteroideos (AINE), como el ibuprofeno. Cules son los signos o sntomas? Los sntomas de esta afeccin incluyen:  Merchant navy officer.  Dificultad o dolor para tragar o la sensacin de tener un bulto en la garganta.  Sabor amargo en la boca.  Mal aliento y [place] gran cantidad de saliva.  Estmago inflamado o con Dentist y eructos.  Dolor en el pecho. El dolor de pecho puede deberse a distintas afecciones. Es importante que consulte al mdico si tiene dolor de [place].  Dificultad para respirar o sibilancias.  Tos constante (crnica) o tos nocturna.  Desgaste del Engineer, structural.  Prdida de peso. Cmo se diagnostica? Esta afeccin se puede diagnosticar en funcin de los antecedentes mdicos y un examen fsico. Para determinar si tiene ERGE leve o grave, el mdico tambin puede controlar cmo usted reacciona al tratamiento. Tambin pueden Constellation Energy, que Kellogg los siguientes:  Un estudio para examinarle el [place] y el esfago con una cmara pequea (endoscopa).  Una prueba para medir el grado de Technical sales engineer.  Una prueba para medir cunta presin hay en el esfago.  Un estudio de deglucin con bario comn o modificado para ver la forma, el tamao  y el funcionamiento del esfago. Cmo se trata? El tratamiento de esta afeccin puede variar segn la gravedad de los sntomas. El mdico puede recomendarle lo siguiente:  Cambios en la dieta.  Medicamentos.  [place]. El [place] del tratamiento es ayudar a Paramedic los sntomas  y Automotive engineer las complicaciones. Siga estas instrucciones en su casa: Comida y bebida  Siga la dieta recomendada por el mdico. Esto puede incluir evitar ciertos alimentos y bebidas, por ejemplo: ? Caf y t negro, con o sin cafena. ? Bebidas que contengan alcohol. ? Bebidas energticas y deportivas. ? Bebidas gaseosas o refrescos. ? Chocolate y cacao. ? Menta y esencia de [place]. ? Ajo y cebolla. ? Rbano picante. ? Alimentos condimentados, picantes y cidos, por ejemplo, todos los tipos de pimientas, [place] en polvo, curry en polvo, vinagre, salsas picantes y Occidental Petroleum. ? Ctricos y sus jugos, por ejemplo, naranjas, limones y limas. ? Alimentos a base de [address], como salsa de [place], [place], salsa picante y pizza con salsa de [place]. ? Alimentos fritos y [place], [place] donas, papas fritas y aderezos ricos en grasas. ? Carnes con alto contenido de grasa, como salchichas, y cortes de carnes rojas y blancas con mucha grasa, por ejemplo, chuletas o costillas, embutidos, jamn y tocino. ? Productos lcteos ricos en grasas, como leche [place], [place] y [place] crema.  Haga comidas pequeas y frecuentes Freight forwarder de comidas abundantes.  Evite beber grandes cantidades de lquidos con las comidas.  Evite comer 2 o 3horas antes de acostarse.  Evite recostarse inmediatamente despus de comer.  No haga ejercicios enseguida despus de comer.   Estilo de vida  No consuma ningn producto que contenga nicotina o tabaco. Estos productos incluyen cigarrillos, tabaco para Theatre manager y aparatos de vapeo, como los Administrator, Civil Service. Si necesita ayuda para dejar de fumar, consulte al American Express.  Trate de reducir el estrs con mtodos como el yoga o la meditacin. Si necesita ayuda para reducir J. C. Penney de estrs, consulte al mdico.  Si tiene sobrepeso, baje hasta llegar a un peso saludable para usted. [place] consejos al mdico para bajar de peso de [place] segura.   Instrucciones  generales  Est atento a cualquier cambio en los sntomas.  Use los medicamentos de venta libre y los recetados solamente como se lo haya indicado el mdico. No tome aspirina, ibuprofeno ni otros antiinflamatorios no esteroideos (AINE) a menos que el mdico le haya indicado que tome estos medicamentos.  Use ropa suelta. No use nada apretado alrededor de la cintura que haga presin sobre el abdomen.  Levante (eleve) la cabecera de la cama aproximadamente 6pulgadas (15cm). Para hacerlo puede usar una cua.  Evite inclinarse si al hacerlo empeoran los sntomas.  Cumpla con todas las visitas de seguimiento. Esto es importante. Comunquese con un mdico si:  Tiene los siguientes sntomas: ? Sntomas nuevos. ? Prdida de peso sin causa aparente. ? Dificultad o dolor al tragar. ? Sibilancias o una tos persistente. ? Voz ronca.  Los sntomas no mejoran con Scientist, research (medical). Solicite ayuda de inmediato si:  Electronics engineer repentino Sears Holdings Corporation, el cuello, la [place], los dientes o la espalda.  De repente se siente transpirado, mareado o aturdido.  Siente falta de aire o Journalist, newspaper.  Vomita y el vmito es de color verde, amarillo o negro, o tiene un aspecto similar a la sangre o a los posos de caf.  Se desmaya.  Tiene heces rojas, sanguinolentas o negras.  No puede tragar, beber  o comer. Estos sntomas pueden representar un problema grave que constituye Radio broadcast assistant. No espere a ver si los sntomas desaparecen. Solicite atencin mdica de inmediato. Comunquese con el servicio de emergencias de su localidad (911 en los [place]). No conduzca por sus propios medios [employer]Max Incorporated. Resumen  El reflujo gastroesofgico ocurre cuando el cido del estmago sube al esfago. La ERGE es una enfermedad en la que el reflujo ocurre con frecuencia, causa sntomas frecuentes o graves, o causa problemas tales como dao en el esfago.  El tratamiento de esta afeccin puede  variar segn la gravedad de los sntomas. El mdico puede indicarle que siga una dieta y haga cambios en su estilo de vida, tome medicamentos o se someta a [place].  Comunquese con un mdico si tiene sntomas nuevos o los sntomas empeoran.  Use los medicamentos de venta libre y los recetados solamente como se lo haya indicado el mdico. No tome aspirina, ibuprofeno ni otros antiinflamatorios no esteroideos (AINE) a menos que el mdico se lo indique.  Concurra a todas las visitas de [address] se lo haya indicado el mdico. Esto es importante. Esta informacin no tiene Theme park manager el consejo del mdico. Asegrese de hacerle al mdico cualquier pregunta que tenga. Document Revised: 07/17/2020 Document Reviewed: 07/17/2020 Elsevier Patient Education  2021 [employer].

## 2021-02-25 NOTE — Progress Notes (Signed)
Patient complains of intermittent pain in the epigastric for the past 2 weeks.  Patient reports the pain occurs after eating. Patient has been using Camomile tea with no relief.

## 2021-02-25 NOTE — Progress Notes (Signed)
Established Patient Office Visit  Subjective:  Patient ID: Miranda Welch, female    DOB: 22-Oct-1981  Age: 40 y.o. MRN: [id number]  CC:  Chief Complaint  Patient presents with  . Abdominal Pain    HPI Miranda Welch reports that she has been having epigastric pain for the past 2 weeks, describes it as nonradiating burning, worse after eating.  States that she will feel bloated after eating . Reports that she has had a couple of episodes of diarrhea in the last couple of days as well.  Denies acid reflux, states she has not tried anything other than tea without relief.    Denies NSAID use    Past Medical History:  Diagnosis Date  . Gestational diabetes   . Thickened endometrium 08/17/2019    Past Surgical History:  Procedure Laterality Date  . CARPAL TUNNEL RELEASE Right 12/25/2020   Procedure: RIGHT CARPAL TUNNEL RELEASE, LEFT CARPAL TUNNEL INJECTION;  Surgeon: Tarry Kos, MD;  Location: [facility];  Service: Orthopedics;  Laterality: Right;  . NO PAST SURGERIES      Family History  Problem Relation Age of Onset  . Diabetes Mother   . Hypertension Mother   . Stroke Mother   . Diabetes Father   . Diabetes Sister   . Diabetes Maternal Grandmother   . Diabetes Maternal Grandfather     Social History   Socioeconomic History  . Marital status: Married    Spouse name: Not on file  . Number of children: 3  . Years of education: 33  . Highest education level: High school graduate  Occupational History  . Not on file  Tobacco Use  . Smoking status: Never Smoker  . Smokeless tobacco: Never Used  Vaping Use  . Vaping Use: Never used  Substance and Sexual Activity  . Alcohol use: Yes    Comment: occasionally  . Drug use: No  . Sexual activity: Not Currently    Birth control/protection: None  Other Topics Concern  . Not on file  Social History Narrative   Moved from [place] 10 yrs ago, lives with husband and 2 daughters (2011, 2004).  Stay at home mother. Walks for exercise irregularly.   Social Determinants of Health   Financial Resource Strain: Not on file  Food Insecurity: Not on file  Transportation Needs: No Transportation Needs  . Lack of Transportation (Medical): No  . Lack of Transportation (Non-Medical): No  Physical Activity: Not on file  Stress: Not on file  Social Connections: Not on file  Intimate Partner Violence: Not on file    Outpatient Medications Prior to Visit  Medication Sig Dispense Refill  . cholecalciferol (VITAMIN D3) 25 MCG (1000 UNIT) tablet Take 2,000 Units by mouth daily.    . ferrous sulfate (FERROUSUL) 325 (65 FE) MG tablet Take 1 tablet (325 mg total) by mouth 2 (two) times daily with a meal. 60 tablet 3   No facility-administered medications prior to visit.    No Known Allergies  ROS Review of Systems  Constitutional: Negative for chills and fever.  HENT: Negative.   Eyes: Negative.   Respiratory: Negative for shortness of breath.   Cardiovascular: Negative for chest pain.  Gastrointestinal: Positive for abdominal pain and diarrhea. Negative for nausea and vomiting.  Endocrine: Negative.   Genitourinary: Negative.   Musculoskeletal: Negative.   Skin: Negative.   Allergic/Immunologic: Negative.   Neurological: Negative for headaches.  Hematological: Negative.   Psychiatric/Behavioral: Negative.  Objective:    Physical Exam Vitals and nursing note reviewed.  Constitutional:      Appearance: She is well-developed.  HENT:     Head: Normocephalic and atraumatic.     Right Ear: External ear normal.     Left Ear: External ear normal.     Nose: Nose normal.     Mouth/Throat:     Mouth: Mucous membranes are moist.     Pharynx: Oropharynx is clear.  Eyes:     Extraocular Movements: Extraocular movements intact.     Conjunctiva/sclera: Conjunctivae normal.     Pupils: Pupils are equal, round, and reactive to light.  Cardiovascular:     Rate and Rhythm:  Normal rate and regular rhythm.  Pulmonary:     Effort: Pulmonary effort is normal.     Breath sounds: Normal breath sounds.  Abdominal:     General: Abdomen is flat. Bowel sounds are normal.     Palpations: Abdomen is soft.     Tenderness: There is abdominal tenderness in the epigastric area. Negative signs include Murphy's sign.  Musculoskeletal:        General: Normal range of motion.     Cervical back: Normal range of motion and neck supple.  Skin:    General: Skin is warm and dry.  Neurological:     General: No focal deficit present.     Mental Status: She is alert and oriented to person, place, and time.  Psychiatric:        Mood and Affect: Mood normal.        Behavior: Behavior normal.        Thought Content: Thought content normal.        Judgment: Judgment normal.     BP 127/85 (BP Location: Left Arm, Patient Position: Sitting, Cuff Size: Normal)   Pulse 70   Temp 98.2 F (36.8 C) (Oral)   Resp 18   Ht 5' (1.524 m)   Wt 178 lb (80.7 kg)   SpO2 97%   BMI 34.76 kg/m  Wt Readings from Last 3 Encounters:  02/25/21 178 lb (80.7 kg)  01/30/21 177 lb 12.8 oz (80.6 kg)  01/09/21 174 lb 8 oz (79.2 kg)     Health Maintenance Due  Topic Date Due  . Hepatitis C Screening  Never done  . PNEUMOCOCCAL POLYSACCHARIDE VACCINE AGE 55-64 HIGH RISK  Never done  . FOOT EXAM  Never done  . OPHTHALMOLOGY EXAM  Never done  . URINE MICROALBUMIN  Never done  . COVID-19 Vaccine (2 - Pfizer 3-dose series) 11/23/2020    There are no preventive care reminders to display for this patient.  Lab Results  Component Value Date   TSH 2.310 12/18/2020   Lab Results  Component Value Date   WBC 8.6 01/30/2021   HGB 12.9 01/30/2021   HCT 41.0 01/30/2021   MCV 76 (L) 01/30/2021   PLT 259 01/30/2021   Lab Results  Component Value Date   NA 138 12/28/2019   K 4.8 12/28/2019   CO2 20 12/28/2019   GLUCOSE 83 12/28/2019   BUN 17 12/28/2019   CREATININE 0.80 12/28/2019   BILITOT  <0.2 12/28/2019   ALKPHOS 53 12/28/2019   AST 15 12/28/2019   ALT 11 12/28/2019   PROT 7.1 12/28/2019   ALBUMIN 4.1 12/28/2019   CALCIUM 9.2 12/28/2019   Lab Results  Component Value Date   CHOL 162 10/18/2019   Lab Results  Component Value Date   HDL 43  10/18/2019   Lab Results  Component Value Date   LDLCALC 75 10/18/2019   Lab Results  Component Value Date   TRIG 268 (H) 10/18/2019   Lab Results  Component Value Date   CHOLHDL 3.8 10/18/2019   Lab Results  Component Value Date   HGBA1C 5.1 12/18/2020      Assessment & Plan:   Problem List Items Addressed This Visit      Digestive   Gastroesophageal reflux disease without esophagitis   Relevant Medications   pantoprazole (PROTONIX) 40 MG tablet     Other   Abdominal pain, epigastric - Primary   Relevant Medications   pantoprazole (PROTONIX) 40 MG tablet   Other Relevant Orders   H Pylori, IGM, IGG, IGA AB    1. Abdominal pain, epigastric Trial Protonix, patient education given on GERD lifestyle modifications.  Red flags given for prompt reevaluation. - H Pylori, IGM, IGG, IGA AB - pantoprazole (PROTONIX) 40 MG tablet; Take 1 tablet (40 mg total) by mouth daily.  Dispense: 30 tablet; Refill: 3  2. Gastroesophageal reflux disease without esophagitis    I have reviewed the patient's medical history (PMH, PSH, Social History, Family History, Medications, and allergies) , and have been updated if relevant. I spent 30 minutes reviewing chart and  face to face time with patient.      Meds ordered this encounter  Medications  . pantoprazole (PROTONIX) 40 MG tablet    Sig: Take 1 tablet (40 mg total) by mouth daily.    Dispense:  30 tablet    Refill:  3    Order Specific Question:   Supervising Provider    Answer:   Storm Frisk [[id number]]    Follow-up: Return if symptoms worsen or fail to improve.    Kasandra Knudsen Mayers, PA-C

## 2021-02-27 ENCOUNTER — Telehealth: Payer: Self-pay | Admitting: *Deleted

## 2021-02-27 LAB — H PYLORI, IGM, IGG, IGA AB
H pylori, IgM Abs: <9 units (ref 0.0–8.9)
H. pylori, IgA Abs: <9 units (ref 0.0–8.9)
H. pylori, IgG AbS: 0.27 Index Value (ref 0.00–0.79)

## 2021-02-27 NOTE — Telephone Encounter (Signed)
-----   Message from Roney Jaffe, [place] sent at 02/27/2021  2:31 PM EDT ----- Please call patient and let her know that she was negative for h pylori; continue current treatment regimen

## 2021-02-27 NOTE — Telephone Encounter (Signed)
Medical Assistant used [employer] to contact patient.  Interpreter Name:  Interpreter #: [phone] Patient was not available, [employer] left patient a voicemail. Voicemail states to give a call back to [place] with [facility] at [phone]. Patient is aware of h pylori being negative and to continue current treatment.

## 2021-03-17 ENCOUNTER — Telehealth: Payer: Self-pay | Admitting: Internal Medicine

## 2021-03-17 NOTE — Telephone Encounter (Signed)
-----   Message from Dionne Bucy sent at 03/17/2021  1:57 PM EDT ----- Regarding: RE: Dermatology Referral Good Afternoon  Patient need to pay $100 for consultation  and after that apply with [facility] Assistant  if is a dx life treating to patient they would help if not  she would have to pay out of her pocket   ----- Message ----- From: Marcine Matar, MD Sent: 03/14/2021   9:24 PM EDT To: Dionne Bucy Subject: RE: Dermatology Referral                       How about [facility] derm? ----- Message ----- From: Dionne Bucy Sent: 03/14/2021   2:09 PM EDT To: Marcine Matar, MD Subject: Dermatology Referral                           Per [facility] Dermatology  Can't treat ( gccn orange card ) Is unable to schedule . Patient is uninsured  And I don't have nowhere else to refer her

## 2021-03-21 ENCOUNTER — Telehealth: Payer: Self-pay | Admitting: Internal Medicine

## 2021-03-21 NOTE — Telephone Encounter (Signed)
-----   Message from Dionne Bucy sent at 03/19/2021  1:48 PM EDT ----- Regarding: RE: Dermatology Referral Good Afternoon I spoke to patient and she wants me to sent a referral to [facility] dermatology and pay out of her pocket .   ----- Message ----- From: Marcine Matar, MD Sent: 03/17/2021   5:45 PM EDT To: Dionne Bucy Subject: RE: Dermatology Referral                       Please call pt and let her know her options and see what she wants to do. ----- Message ----- From: Dionne Bucy Sent: 03/17/2021   1:59 PM EDT To: Marcine Matar, MD Subject: RE: Dermatology Referral                       Good Afternoon  Patient need to pay $100 for consultation  and after that apply with [facility] Assistant  if is a dx life treating to patient they would help if not  she would have to pay out of her pocket   ----- Message ----- From: Marcine Matar, MD Sent: 03/14/2021   9:24 PM EDT To: Dionne Bucy Subject: RE: Dermatology Referral                       How about [facility] derm? ----- Message ----- From: Dionne Bucy Sent: 03/14/2021   2:09 PM EDT To: Marcine Matar, MD Subject: Dermatology Referral                           Per [facility] Dermatology  Can't treat ( gccn orange card ) Is unable to schedule . Patient is uninsured  And I don't have nowhere else to refer her

## 2021-04-02 ENCOUNTER — Ambulatory Visit: Payer: Self-pay | Admitting: Physician Assistant

## 2021-05-19 ENCOUNTER — Ambulatory Visit: Payer: Self-pay | Admitting: *Deleted

## 2021-05-19 ENCOUNTER — Other Ambulatory Visit: Payer: Self-pay

## 2021-05-19 ENCOUNTER — Emergency Department (HOSPITAL_COMMUNITY): Payer: Self-pay

## 2021-05-19 ENCOUNTER — Encounter (HOSPITAL_COMMUNITY): Payer: Self-pay

## 2021-05-19 ENCOUNTER — Emergency Department (HOSPITAL_COMMUNITY)
Admission: EM | Admit: 2021-05-19 | Discharge: 2021-05-19 | Disposition: A | Discharge status: Home / Self Care | Payer: Self-pay | Attending: Emergency Medicine | Admitting: Emergency Medicine

## 2021-05-19 DIAGNOSIS — R1011 Right upper quadrant pain: Secondary | ICD-10-CM | POA: Insufficient documentation

## 2021-05-19 DIAGNOSIS — K760 Fatty (change of) liver, not elsewhere classified: Secondary | ICD-10-CM

## 2021-05-19 DIAGNOSIS — K219 Gastro-esophageal reflux disease without esophagitis: Secondary | ICD-10-CM | POA: Insufficient documentation

## 2021-05-19 LAB — COMPREHENSIVE METABOLIC PANEL
ALT: 27 U/L (ref 0–44)
AST: 27 U/L (ref 15–41)
Albumin: 3.6 g/dL (ref 3.5–5.0)
Alkaline Phosphatase: 54 U/L (ref 38–126)
Anion gap: 9 (ref 5–15)
BUN: 18 mg/dL (ref 6–20)
CO2: 23 mmol/L (ref 22–32)
Calcium: 9.1 mg/dL (ref 8.9–10.3)
Chloride: 104 mmol/L (ref 98–111)
Creatinine, Ser: 0.78 mg/dL (ref 0.44–1.00)
GFR, Estimated: >60 mL/min (ref >60)
Glucose, Bld: 83 mg/dL (ref 70–99)
Potassium: 4.2 mmol/L (ref 3.5–5.1)
Sodium: 136 mmol/L (ref 135–145)
Total Bilirubin: 0.7 mg/dL (ref 0.3–1.2)
Total Protein: 7 g/dL (ref 6.5–8.1)

## 2021-05-19 LAB — URINALYSIS, ROUTINE W REFLEX MICROSCOPIC
Bilirubin Urine: NEGATIVE
Glucose, UA: NEGATIVE mg/dL
Hgb urine dipstick: NEGATIVE
Ketones, ur: NEGATIVE mg/dL
Leukocytes,Ua: NEGATIVE
Nitrite: NEGATIVE
Protein, ur: NEGATIVE mg/dL
Specific Gravity, Urine: 1.027 (ref 1.005–1.030)
pH: 6 (ref 5.0–8.0)

## 2021-05-19 LAB — I-STAT BETA HCG BLOOD, ED (MC, WL, AP ONLY): I-stat hCG, quantitative: <5 m[IU]/mL (ref <5)

## 2021-05-19 LAB — CBC
HCT: 42.2 % (ref 36.0–46.0)
Hemoglobin: 13.3 g/dL (ref 12.0–15.0)
MCH: 27.2 pg (ref 26.0–34.0)
MCHC: 31.5 g/dL (ref 30.0–36.0)
MCV: 86.3 fL (ref 80.0–100.0)
Platelets: 215 10*3/uL (ref 150–400)
RBC: 4.89 MIL/uL (ref 3.87–5.11)
RDW: 15.1 % (ref 11.5–15.5)
WBC: 6.7 10*3/uL (ref 4.0–10.5)
nRBC: 0 % (ref 0.0–0.2)

## 2021-05-19 LAB — LIPASE, BLOOD: Lipase: 27 U/L (ref 11–51)

## 2021-05-19 MED ORDER — MORPHINE SULFATE (PF) 4 MG/ML IV SOLN
4.0000 mg | Freq: Once | INTRAVENOUS | Status: AC
  Administered 2021-05-19: 4 mg via INTRAVENOUS
  Filled 2021-05-19: qty 1

## 2021-05-19 MED ORDER — ONDANSETRON HCL 4 MG/2ML IJ SOLN
4.0000 mg | Freq: Once | INTRAMUSCULAR | Status: AC
  Administered 2021-05-19: 4 mg via INTRAVENOUS
  Filled 2021-05-19: qty 2

## 2021-05-19 MED ORDER — FAMOTIDINE 20 MG PO TABS: 20.0000 mg | ORAL_TABLET | Freq: Two times a day (BID) | ORAL | 0 refills | Status: DC

## 2021-05-19 NOTE — ED Notes (Signed)
Transported to [place].

## 2021-05-19 NOTE — ED Triage Notes (Signed)
Patient complains of RUQ pain x 8 days. Patient reports pain radiating to flank, no associated symptoms. denis dysuria.

## 2021-05-19 NOTE — Telephone Encounter (Signed)
Contacted pt to schedule an appt pt didn't answer lvm  ?

## 2021-05-19 NOTE — Telephone Encounter (Signed)
Was unable to lvm due to vm being full 

## 2021-05-19 NOTE — Discharge Instructions (Signed)
The ultrasound suggests that you have what is called "fatty liver disease". This can be caused by poor diet and/or alcohol use. Your gallbladder is normal.  I recommend that you schedule an appointment with your primary care doctor who may recommend further testing.  For now, I am prescribing you a medication that can help with abdominal pain caused by too much acid in the stomach. Please start taking it as directed.

## 2021-05-19 NOTE — Telephone Encounter (Signed)
Assisted by Willey Blade [phone]. Pt reports right sided abdominal pain, "Under ribs." Onset 1 week ago, worsening. Constant 8/10, nausea, no vomiting. Unsure if febrile. Advised ED, pt states will follow disposition. Care advise given, verbalizes understanding.  Reason for Disposition . [1] SEVERE pain (e.g., excruciating) AND [2] present > 1 hour  Answer Assessment - Initial Assessment Questions 1. LOCATION: "Where does it hurt?"      Right side underneath rib 2. RADIATION: "Does the pain shoot anywhere else?" (e.g., chest, back)     No 3. ONSET: "When did the pain begin?" (e.g., minutes, hours or days ago)      1 week ago 4. SUDDEN: "Gradual or sudden onset?"     Suddenly 5. PATTERN "Does the pain come and go, or is it constant?"    - If constant: "Is it getting better, staying the same, or worsening?"      (Note: Constant means the pain never goes away completely; most serious pain is constant and it progresses)     - If intermittent: "How long does it last?" "Do you have pain now?"     (Note: Intermittent means the pain goes away completely between bouts)     Constant 6. SEVERITY: "How bad is the pain?"  (e.g., Scale 1-10; mild, moderate, or severe)   - MILD (1-3): doesn't interfere with normal activities, abdomen soft and not tender to touch    - MODERATE (4-7): interferes with normal activities or awakens from sleep, abdomen tender to touch    - SEVERE (8-10): excruciating pain, doubled over, unable to do any normal activities      8/10 7. RECURRENT SYMPTOM: "Have you ever had this type of stomach pain before?" If Yes, ask: "When was the last time?" and "What happened that time?"      No 8. CAUSE: "What do you think is causing the stomach pain?"     Not sure 9. RELIEVING/AGGRAVATING FACTORS: "What makes it better or worse?" (e.g., movement, antacids, bowel movement)     Advil 10. OTHER SYMPTOMS: "Do you have any other symptoms?" (e.g., back pain, diarrhea, fever, urination  pain, vomiting)       Nausea, dizziness  Protocols used: ABDOMINAL PAIN - [facility]

## 2021-05-19 NOTE — ED Provider Notes (Signed)
[facility] EMERGENCY DEPARTMENT Provider Note   CSN: [id number] Arrival date & time: 05/19/21  5188     History No chief complaint on file.   Miranda Welch is a 40 y.o. female.  Miranda Welch is a 40 y.o. female with history of gestational diabetes, vitamin D deficiency, anemia, who presents to the emergency department for evaluation of abdominal pain.  Patient reports for the past 8 days she has had persistent right upper quadrant abdominal pain.  She reports it waxes and wanes in intensity but never resolves.  She denies this being worse with eating.  Denies associated vomiting or diarrhea.  No blood in the stool.  She has not noted any fevers or chills.  No dysuria, urinary frequency or hematuria.  She reports pain is well localized to the right upper quadrant, and she occasionally has some right-sided flank pain but no other abdominal pain elsewhere.  She denies any prior history of similar pain.  No previous abdominal surgeries.  She does report that all of her sisters have had gallbladder issues and had to have them removed and she is worried that could be going on.  She has tried some tea that a family member gave her, as well as some ibuprofen without any improvement.  The history is provided by the patient. The history is limited by a language barrier. A language interpreter was used.       Past Medical History:  Diagnosis Date  . Gestational diabetes   . Thickened endometrium 08/17/2019    Patient Active Problem List   Diagnosis Date Noted  . Abdominal pain, epigastric 02/25/2021  . Gastroesophageal reflux disease without esophagitis 02/25/2021  . Influenza vaccine refused 01/30/2021  . Carpal tunnel syndrome on right   . Carpal tunnel syndrome on left   . Vitamin D deficiency 12/19/2020  . Prediabetes 12/19/2020  . Fatigue 12/19/2020  . Anemia 12/19/2020  . Abnormal uterine bleeding (AUB) 06/15/2019  . Shoulder pain, right 05/06/2012  .  Obesity 05/06/2012  . PALPITATIONS 02/10/2007    Past Surgical History:  Procedure Laterality Date  . CARPAL TUNNEL RELEASE Right 12/25/2020   Procedure: RIGHT CARPAL TUNNEL RELEASE, LEFT CARPAL TUNNEL INJECTION;  Surgeon: Tarry Kos, MD;  Location: [facility];  Service: Orthopedics;  Laterality: Right;  . NO PAST SURGERIES       OB History    Gravida  3   Para  3   Term  3   Preterm      AB      Living  3     SAB      IAB      Ectopic      Multiple      Live Births  3           Family History  Problem Relation Age of Onset  . Diabetes Mother   . Hypertension Mother   . Stroke Mother   . Diabetes Father   . Diabetes Sister   . Diabetes Maternal Grandmother   . Diabetes Maternal Grandfather     Social History   Tobacco Use  . Smoking status: Never Smoker  . Smokeless tobacco: Never Used  Vaping Use  . Vaping Use: Never used  Substance Use Topics  . Alcohol use: Yes    Comment: occasionally  . Drug use: No    Home Medications Prior to Admission medications   Medication Sig Start Date End Date Taking? Authorizing Provider  cholecalciferol (VITAMIN D3) 25 MCG (1000 UNIT) tablet Take 2,000 Units by mouth daily.    [provider]  ferrous sulfate (FERROUSUL) 325 (65 FE) MG tablet Take 1 tablet (325 mg total) by mouth 2 (two) times daily with a meal. 02/07/21   Marcine Matar, MD  pantoprazole (PROTONIX) 40 MG tablet Take 1 tablet (40 mg total) by mouth daily. 02/25/21   Mayers, Cari S, PA-C    Allergies    Patient has no known allergies.  Review of Systems   Review of Systems  Constitutional: Negative for chills and fever.  HENT: Negative.   Respiratory: Negative for cough and shortness of breath.   Cardiovascular: Negative for chest pain.  Gastrointestinal: Positive for abdominal pain. Negative for diarrhea, nausea and vomiting.  Genitourinary: Positive for flank pain. Negative for dysuria, hematuria, vaginal  bleeding and vaginal discharge.  Musculoskeletal: Negative for arthralgias and myalgias.  Skin: Negative for color change and rash.  Neurological: Negative for dizziness, syncope and light-headedness.  All other systems reviewed and are negative.   Physical Exam Updated Vital Signs BP 138/75   Pulse 60   Temp 98.5 F (36.9 C) (Oral)   Resp 16   SpO2 100%   Physical Exam Vitals and nursing note reviewed.  Constitutional:      General: She is not in acute distress.    Appearance: She is well-developed. She is obese. She is not ill-appearing or diaphoretic.     Comments: Well-appearing and in no distress  HENT:     Head: Normocephalic and atraumatic.  Eyes:     General:        Right eye: No discharge.        Left eye: No discharge.     Pupils: Pupils are equal, round, and reactive to light.  Cardiovascular:     Rate and Rhythm: Normal rate and regular rhythm.     Heart sounds: Normal heart sounds.  Pulmonary:     Effort: Pulmonary effort is normal. No respiratory distress.     Breath sounds: Normal breath sounds. No wheezing or rales.  Abdominal:     General: Bowel sounds are normal. There is no distension.     Palpations: Abdomen is soft. There is no mass.     Tenderness: There is abdominal tenderness in the right upper quadrant. There is no guarding. Negative signs include Murphy's sign.     Comments: Abdomen soft, nondistended, bowel sounds present throughout, there is some mild right upper quadrant tenderness without guarding, negative Murphy sign, all other quadrants nontender, no CVA tenderness bilaterally  Musculoskeletal:        General: No deformity.     Cervical back: Neck supple.  Skin:    General: Skin is warm and dry.     Capillary Refill: Capillary refill takes less than 2 seconds.  Neurological:     Mental Status: She is alert.     Coordination: Coordination normal.     Comments: Speech is clear, able to follow commands Moves extremities without ataxia,  coordination intact  Psychiatric:        Mood and Affect: Mood normal.        Behavior: Behavior normal.     ED Results / Procedures / Treatments   Labs (all labs ordered are listed, but only abnormal results are displayed) Labs Reviewed  URINALYSIS, ROUTINE W REFLEX MICROSCOPIC - Abnormal; Notable for the following components:      Result Value   APPearance HAZY (*)  All other components within normal limits  LIPASE, BLOOD  COMPREHENSIVE METABOLIC PANEL  CBC  I-STAT BETA HCG BLOOD, ED ([facility], [facility], [facility] ONLY)    EKG None  Radiology No results found.  Procedures Procedures   Medications Ordered in ED Medications  ondansetron (ZOFRAN) injection 4 mg (4 mg Intravenous Given 05/19/21 1402)  morphine 4 MG/ML injection 4 mg (4 mg Intravenous Given 05/19/21 1403)    ED Course  I have reviewed the triage vital signs and the nursing notes.  Pertinent labs & imaging results that were available during my care of the patient were reviewed by me and considered in my medical decision making (see chart for details).    MDM Rules/Calculators/A&P                         Patient presents to the ED with complaints of abdominal pain. Patient nontoxic appearing, in no apparent distress, vitals WNL. On exam patient tender to palpation in the RUQ, no peritoneal signs. Will evaluate with labs and RUQ [place]. Analgesics and anti-emetics administered.   Additional history obtained:  Additional history obtained from chart review & nursing note review.   Lab Tests:  I Ordered, reviewed, and interpreted labs, which included:  CBC: No leukocytosis, normal hemoglobin CMP: No electrolyte derangements, normal renal and liver function Lipase: WNL UA: No hematuria to suggest renal stone, no signs of UTI Preg test: Negative  Imaging Studies ordered:  I ordered imaging studies which included right upper quadrant ultrasound.  Ultrasound pending at shift change.  ED Course:   Concern for potential  cholecystitis versus symptomatic cholelithiasis, also considered kidney stone or Pyelo but urine without blood or signs of infection.  Pain could also be related to gastritis or PUD.  Overall lab work is very reassuring and patient is well-appearing.  At shift change care signed out to Dr. Wynona Neat, EM resident, who will follow up on right upper quadrant ultrasound, if negative and pain is improved patient can be discharged home, consider starting PPI.   Final Clinical Impression(s) / ED Diagnoses Final diagnoses:  RUQ abdominal pain    Rx / DC Orders ED Discharge Orders    None       Legrand Rams 05/19/21 1512    Gwyneth Sprout, MD 05/20/21 1150

## 2021-05-19 NOTE — ED Provider Notes (Signed)
  Physical Exam  BP (!) 147/84   Pulse 64   Temp 98.5 F (36.9 C) (Oral)   Resp 16   SpO2 100%    ED Course/Procedures   Clinical Course as of 05/19/21 1604  Mon May 19, 2021  1518 RUQ U/S. If negative, put on PPI Can probably understand english [ZB]    Clinical Course User Index [ZB] Ardeen Fillers, DO    Procedures  MDM   Ultrasound with evidence likely of hepatic steatosis however normal-appearing gallbladder. I reviewed her lab work, presentation and history.  Agree with previous team's assessment and impression.  Sound like she also was having some esophageal reflux.  Will start on Pepcid.  She will follow up with her PCP.     Ardeen Fillers, DO 05/19/21 1608    Cheryll Cockayne, MD 05/30/21 [phone]

## 2021-05-19 NOTE — ED Notes (Signed)
Patient verbalizes understanding of discharge instructions. Opportunity for questioning and answers were provided. Pt discharged from ED. 

## 2021-06-25 ENCOUNTER — Other Ambulatory Visit: Payer: Self-pay

## 2021-06-25 ENCOUNTER — Ambulatory Visit: Payer: Self-pay | Attending: Physician Assistant | Admitting: Physician Assistant

## 2021-06-25 VITALS — BP 116/78 | HR 75 | Resp 16 | Wt 166.8 lb

## 2021-06-25 DIAGNOSIS — K760 Fatty (change of) liver, not elsewhere classified: Secondary | ICD-10-CM | POA: Insufficient documentation

## 2021-06-25 DIAGNOSIS — R1013 Epigastric pain: Secondary | ICD-10-CM

## 2021-06-25 DIAGNOSIS — D649 Anemia, unspecified: Secondary | ICD-10-CM

## 2021-06-25 DIAGNOSIS — E559 Vitamin D deficiency, unspecified: Secondary | ICD-10-CM

## 2021-06-25 DIAGNOSIS — Z09 Encounter for follow-up examination after completed treatment for conditions other than malignant neoplasm: Secondary | ICD-10-CM

## 2021-06-25 DIAGNOSIS — Z789 Other specified health status: Secondary | ICD-10-CM

## 2021-06-25 MED ORDER — FAMOTIDINE 20 MG PO TABS: 20.0000 mg | ORAL_TABLET | Freq: Two times a day (BID) | ORAL | 2 refills | Status: DC

## 2021-06-25 NOTE — Progress Notes (Signed)
Patient ID: Miranda Welch, female   DOB: 04-Aug-1981, 40 y.o.   MRN: [id number]    Miranda Welch, is a 40 y.o. female  [id number]:[id number]  [id number]:[id number]  DOB - 1980/12/31  Subjective:  Chief Complaint and HPI: Miranda Welch is a 40 y.o. female here today for a follow up visit After ED visit 05/19/2021 for abdominal pain.  She was found to fatty liver with normal gallbladder.  She has been working on losing weight and has lost 12 pounds since march of this year.  Still has the RUQ pain but not as severe.  She never started taking the pepcid but has still been taking the pantoprazole.  She is still taking iron tabs.  CBC, lipase, and CMP WNL.  No fever.  No V/D.  No melena/hematochezia.    From ED note: Miranda Welch is a 40 y.o. female.   Miranda Welch is a 40 y.o. female with history of gestational diabetes, vitamin D deficiency, anemia, who presents to the emergency department for evaluation of abdominal pain.  Patient reports for the past 8 days she has had persistent right upper quadrant abdominal pain.  She reports it waxes and wanes in intensity but never resolves.  She denies this being worse with eating.  Denies associated vomiting or diarrhea.  No blood in the stool.  She has not noted any fevers or chills.  No dysuria, urinary frequency or hematuria.  She reports pain is well localized to the right upper quadrant, and she occasionally has some right-sided flank pain but no other abdominal pain elsewhere.  She denies any prior history of similar pain.  No previous abdominal surgeries.  She does report that all of her sisters have had gallbladder issues and had to have them removed and she is worried that could be going on.  She has tried some tea that a family member gave her, as well as some ibuprofen without any improvement.  Lab Tests: I Ordered, reviewed, and interpreted labs, which included: CBC: No leukocytosis, normal hemoglobin CMP: No electrolyte  derangements, normal renal and liver function Lipase: WNL UA: No hematuria to suggest renal stone, no signs of UTI Preg test: Negative   Imaging Studies ordered: I ordered imaging studies which included right upper quadrant ultrasound.  Ultrasound pending at shift change.   ED Course:    Concern for potential cholecystitis versus symptomatic cholelithiasis, also considered kidney stone or Pyelo but urine without blood or signs of infection.  Pain could also be related to gastritis or PUD.  Overall lab work is very reassuring and patient is well-appearing.  At shift change care signed out to Dr. Wynona Neat, EM resident, who will follow up on right upper quadrant ultrasound, if negative and pain is improved patient can be discharged home, consider starting PPI.   ED/Hospital notes reviewed.    ROS:   Constitutional:  No f/c, No night sweats, No unexplained weight loss. EENT:  No vision changes, No blurry vision, No hearing changes. No mouth, throat, or ear problems.  Respiratory: No cough, No SOB Cardiac: No CP, no palpitations GI:  improving abd pain, No N/V/D. GU: No Urinary s/sx Musculoskeletal: No joint pain Neuro: No headache, no dizziness, no motor weakness.  Skin: No rash Endocrine:  No polydipsia. No polyuria.  Psych: Denies SI/HI  No problems updated.  ALLERGIES: No Known Allergies  PAST MEDICAL HISTORY: Past Medical History:  Diagnosis Date   Gestational diabetes    Thickened endometrium 08/17/2019    MEDICATIONS  AT HOME: Prior to Admission medications   Medication Sig Start Date End Date Taking? Authorizing Provider  cholecalciferol (VITAMIN D3) 25 MCG (1000 UNIT) tablet Take 2,000 Units by mouth daily.    [provider]  famotidine (PEPCID) 20 MG tablet Take 1 tablet (20 mg total) by mouth 2 (two) times daily. 06/25/21   Anders Simmonds, PA-C  ferrous sulfate (FERROUSUL) 325 (65 FE) MG tablet Take 1 tablet (325 mg total) by mouth 2 (two) times daily with a  meal. 02/07/21   Marcine Matar, MD  pantoprazole (PROTONIX) 40 MG tablet Take 1 tablet (40 mg total) by mouth daily. 02/25/21   Mayers, Cari S, PA-C     Objective:  EXAM:   Vitals:   06/25/21 1546  BP: 116/78  Pulse: 75  Resp: 16  SpO2: 98%  Weight: 166 lb 12.8 oz (75.7 kg)    General appearance : A&OX3. NAD. Non-toxic-appearing HEENT: Atraumatic and Normocephalic.  PERRLA. EOM intact.   Chest/Lungs:  Breathing-non-labored, Good air entry bilaterally, breath sounds normal without rales, rhonchi, or wheezing  CVS: S1 S2 regular, no murmurs, gallops, rubs  Extremities: Bilateral Lower Ext shows no edema, both legs are warm to touch with = pulse throughout Neurology:  CN II-XII grossly intact, Non focal.   Psych:  TP linear. J/I WNL. Normal speech. Appropriate eye contact and affect.  Skin:  No Rash  Data Review Lab Results  Component Value Date   HGBA1C 5.1 12/18/2020   HGBA1C 5.7 (H) 06/12/2020   HGBA1C 5.6 10/18/2019     Assessment & Plan   1. Fatty liver Continue to eliminate fatty foods/sugars from diet.  Great work on weight loss so far.  Consider repeat U/S in 1 year - famotidine (PEPCID) 20 MG tablet; Take 1 tablet (20 mg total) by mouth 2 (two) times daily.  Dispense: 60 tablet; Refill: 2 Continue pantoprazole but she doesn't need a RF  2. Anemia, unspecified type Stable-continue iron  3. Vitamin D deficiency Continue OTC vitamin D 2000 units daily  4. Abdominal pain, epigastric - famotidine (PEPCID) 20 MG tablet; Take 1 tablet (20 mg total) by mouth 2 (two) times daily.  Dispense: 60 tablet; Refill: 2  5. Encounter for examination following treatment at hospital  6. Language barrier AMN interpreters used and additional time performing visit was required.      Patient have been counseled extensively about nutrition and exercise  Return for see PCP in about 4 months.  The patient was given clear instructions to go to ER or return to medical  center if symptoms don't improve, worsen or new problems develop. The patient verbalized understanding. The patient was told to call to get lab results if they haven't heard anything in the next week.     Georgian Co, PA-C [facility] [place], [place] [phone]   06/25/2021, 4:05 PM

## 2021-06-25 NOTE — Patient Instructions (Addendum)
Do not drink alcohol.  Limit sugar and fatty foods.   Enfermedad del hgado graso Fatty Liver Disease  El hgado transforma los alimentos en energa, elimina las sustancias txicas de la [place] protenas importantes y absorbe las vitaminas necesarias de los alimentos. La enfermedad del hgado graso ocurre cuando se acumula demasiada grasa en las clulas del hgado. La enfermedad del hgado grasotambin se llama esteatosis heptica. En muchos casos, la enfermedad del hgado graso no provoca sntomas ni problemas. Con frecuencia, se diagnostica cuando se realizan estudios por otros motivos. Sin embargo, con el [place], el hgado graso puede provocar una inflamacin que posiblemente cause problemas hepticos ms graves, como la fibrosis heptica (cirrosis) o insuficiencia heptica. El hgado graso se asocia con la resistencia a la insulina, el aumento de la grasa corporal, la presin arterial alta (hipertensin) y el colesterol elevado. Estas son caractersticas del sndrome metablico yaumentan el riesgo de accidente cerebrovascular, diabetes y enfermedad cardaca. Cules son las causas? Esta afeccin puede estar causada por componentes del sndrome metablico: Obesidad. Resistencia a la insulina. Colesterol alto. Otras causas: Consumo excesivo de alcohol. Nutricin deficiente. Sndrome de [place]. Embarazo. Determinados medicamentos. Txicos. Algunas infecciones virales. Qu incrementa el riesgo? Es ms probable que tengan esta afeccin las personas que: Consumen alcohol en exceso. Tienen sobrepeso. Tienen diabetes. Tienen hepatitis. Tienen un nivel alto de triglicridos. Estn embarazadas. Cules son los signos o sntomas? Con frecuencia, la enfermedad del hgado graso no provoca sntomas. Si se desarrollan sntomas, estos pueden incluir: Fatiga y debilidad. Prdida de peso. Confusin. Nuseas, vmitos o dolor abdominal. Color amarillo en la piel y en la zona blanca de  los ojos (ictericia). Picazn en la piel. Cmo se diagnostica? Este trastorno puede diagnosticarse mediante: Un examen fsico y los antecedentes mdicos. Anlisis de [place]. Estudios de diagnstico por imgenes, como ecografa, exploracin por tomografa computarizada (TC) o Health visitor (RM). Biopsia de hgado. Se extrae una pequea muestra de tejido del hgado usando [place]. La muestra se examina en el microscopio. Cmo se trata? Con frecuencia, la enfermedad del hgado graso es causada por otras afecciones. El tratamiento para el hgado graso puede incluir medicamentos y cambios en el estilo de vida para controlar enfermedades como: Alcoholismo. Colesterol alto. Diabetes. Sobrepeso u obesidad. Siga estas instrucciones en su casa:  No beba alcohol. Si tiene problemas para dejar de beber, consulte al mdico cmo puede dejar de beber de forma segura con la ayuda de medicamentos o un programa con supervisin. Esto es importante para evitar que la afeccin empeore. Siga una dieta saludable como se lo haya indicado el mdico. Consulte al mdico sobre trabajar con un nutricionista para Runner, broadcasting/film/video de alimentacin. Haga ejercicio regularmente. Esto puede ayudarlo a [employer], y a Public house manager y la diabetes. Hable con el mdico sobre qu actividades son mejores para usted y IT trainer de ejercicios. Use los medicamentos de venta libre y los recetados solamente como se lo haya indicado el mdico. Cumpla con todas las visitas de seguimiento. Esto es importante. Comunquese con un mdico si: Tiene dificultad para controlar lo siguiente: Nivel de [place]. Esto es muy importante si tiene diabetes. Colesterol. El consumo de alcohol. Solicite ayuda de inmediato si: Siente dolor abdominal. Tiene ictericia. Tiene nuseas y vomita. Vomita sangre o una sustancia similar al poso del caf. Las heces son negras, alquitranadas o  sanguinolentas. Resumen La enfermedad del hgado graso se desarrolla cuando se acumula demasiada grasa en las clulas del hgado. Con  frecuencia, la enfermedad del hgado no causa sntomas ni problemas. Sin embargo, con Museum/gallery conservator, el hgado graso puede provocar una inflamacin que puede causar problemas hepticos ms graves, como fibrosis heptica (cirrosis). Es ms probable que desarrolle esta afeccin si consume alcohol en exceso, est embarazada, tiene sobrepeso, diabetes, hepatitis o altos niveles de triglicridos o de colesterol. Comunquese con el mdico si tiene problemas para controlar su azcar en la sangre, el colesterol o el consumo de alcohol. Esta informacin no tiene Theme park manager el consejo del mdico. Asegresede hacerle al mdico cualquier pregunta que tenga. Document Revised: 10/08/2020 Document Reviewed: 10/08/2020 Elsevier Patient Education  2022 [employer].

## 2021-10-30 ENCOUNTER — Other Ambulatory Visit: Payer: Self-pay

## 2021-10-30 ENCOUNTER — Encounter: Payer: Self-pay | Admitting: Internal Medicine

## 2021-10-30 ENCOUNTER — Ambulatory Visit: Payer: Self-pay | Attending: Internal Medicine | Admitting: Internal Medicine

## 2021-10-30 VITALS — BP 120/79 | HR 69 | Resp 16 | Wt 157.4 lb

## 2021-10-30 DIAGNOSIS — D500 Iron deficiency anemia secondary to blood loss (chronic): Secondary | ICD-10-CM

## 2021-10-30 DIAGNOSIS — Z23 Encounter for immunization: Secondary | ICD-10-CM

## 2021-10-30 DIAGNOSIS — H02729 Madarosis of unspecified eye, unspecified eyelid and periocular area: Secondary | ICD-10-CM

## 2021-10-30 DIAGNOSIS — E669 Obesity, unspecified: Secondary | ICD-10-CM

## 2021-10-30 DIAGNOSIS — K760 Fatty (change of) liver, not elsewhere classified: Secondary | ICD-10-CM

## 2021-10-30 NOTE — Progress Notes (Signed)
Patient ID: MORNINGSTAR TOFT, female    DOB: May 31, 1981  MRN: [id number]  CC: Anemia   Subjective: Miranda Welch is a 40 y.o. female who presents for chronic disease management. Her concerns today include:  Hx of obesity, Vit D def, IDA, preDM, fatty liver  Obesity/fatty liver:  loss 21 lbs since March of this yr.  "It was hard but I did it." -stopped eating fast foods, breads and sugary stuff. -goes to gym 3-4x/wk and has trainer Want to know if liver needs to be rechecked with [place]. -no longer takes Pepcid and Pantoprazole.  No longer has RUQ pain and not bothered as much with GERD since she changed her eating habits.   IDA: still taking iron supplement once a day.  Last CBC 5 mths ago revealed nl H/H of 13/42. Reports menses not heavy and last 4-5 days, first 2 days heavy.    Noticed thinning of the eyelashes of the upper eyelids.    Patient Active Problem List   Diagnosis Date Noted   Fatty liver 06/25/2021   Abdominal pain, epigastric 02/25/2021   Gastroesophageal reflux disease without esophagitis 02/25/2021   Influenza vaccine refused 01/30/2021   Carpal tunnel syndrome on right    Carpal tunnel syndrome on left    Vitamin D deficiency 12/19/2020   Prediabetes 12/19/2020   Fatigue 12/19/2020   Anemia 12/19/2020   Abnormal uterine bleeding (AUB) 06/15/2019   Shoulder pain, right 05/06/2012   Obesity 05/06/2012   PALPITATIONS 02/10/2007     Current Outpatient Medications on File Prior to Visit  Medication Sig Dispense Refill   cholecalciferol (VITAMIN D3) 25 MCG (1000 UNIT) tablet Take 2,000 Units by mouth daily.     ferrous sulfate (FERROUSUL) 325 (65 FE) MG tablet Take 1 tablet (325 mg total) by mouth 2 (two) times daily with a meal. 60 tablet 3   pantoprazole (PROTONIX) 40 MG tablet Take 1 tablet (40 mg total) by mouth daily. 30 tablet 3   No current facility-administered medications on file prior to visit.    No Known Allergies  Social History    Socioeconomic History   Marital status: Married    Spouse name: Not on file   Number of children: 3   Years of education: 12   Highest education level: High school graduate  Occupational History   Not on file  Tobacco Use   Smoking status: Never   Smokeless tobacco: Never  Vaping Use   Vaping Use: Never used  Substance and Sexual Activity   Alcohol use: Yes    Comment: occasionally   Drug use: No   Sexual activity: Not Currently    Birth control/protection: None  Other Topics Concern   Not on file  Social History Narrative   Moved from [place] 10 yrs ago, lives with husband and 2 daughters (2011, 2004). Stay at home mother. Walks for exercise irregularly.   Social Determinants of Health   Financial Resource Strain: Not on file  Food Insecurity: Not on file  Transportation Needs: No Transportation Needs   Lack of Transportation (Medical): No   Lack of Transportation (Non-Medical): No  Physical Activity: Not on file  Stress: Not on file  Social Connections: Not on file  Intimate Partner Violence: Not on file    Family History  Problem Relation Age of Onset   Diabetes Mother    Hypertension Mother    Stroke Mother    Diabetes Father    Diabetes Sister    Diabetes  Maternal Grandmother    Diabetes Maternal Grandfather     Past Surgical History:  Procedure Laterality Date   CARPAL TUNNEL RELEASE Right 12/25/2020   Procedure: RIGHT CARPAL TUNNEL RELEASE, LEFT CARPAL TUNNEL INJECTION;  Surgeon: Tarry Kos, MD;  Location: [facility];  Service: Orthopedics;  Laterality: Right;   NO PAST SURGERIES      ROS: Review of Systems Negative except as stated above  PHYSICAL EXAM: BP 120/79   Pulse 69   Resp 16   Wt 157 lb 6.4 oz (71.4 kg)   SpO2 98%   BMI 30.74 kg/m   Wt Readings from Last 3 Encounters:  10/30/21 157 lb 6.4 oz (71.4 kg)  06/25/21 166 lb 12.8 oz (75.7 kg)  02/25/21 178 lb (80.7 kg)    Physical Exam  General appearance -  alert, well appearing, middle-age Hispanic female and in no distress Mental status - normal mood, behavior, speech, dress, motor activity, and thought processes Eyes -slight thinning of the eyelashes of the upper lids. Mouth - mucous membranes moist, pharynx normal without lesions Neck - supple, no significant adenopathy Extremities - peripheral pulses normal, no pedal edema, no clubbing or cyanosis   CMP Latest Ref Rng & Units 05/19/2021 12/28/2019 10/18/2019  Glucose 70 - 99 mg/dL 83 83 762(G)  BUN 6 - 20 mg/dL 18 17 17   Creatinine 0.44 - 1.00 mg/dL 3.15 1.76  Sodium 135 - 145 mmol/L 136 138 138  Potassium 3.5 - 5.1 mmol/L 4.2 4.8 4.3  Chloride 98 - 111 mmol/L 104 103 102  CO2 22 - 32 mmol/L 23 20 22   Calcium 8.9 - 10.3 mg/dL 9.1 9.2 9.7  Total Protein 6.5 - 8.1 g/dL 7.0 7.1 7.1  Total Bilirubin 0.3 - 1.2 mg/dL 0.7 1.60  Alkaline Phos 38 - 126 U/L 54 53 66  AST 15 - 41 U/L 27 15 15   ALT 0 - 44 U/L 27 11 13    Lipid Panel     Component Value Date/Time   CHOL 162 10/18/2019 1421   TRIG 268 (H) 10/18/2019 1421   HDL 43 10/18/2019 1421   CHOLHDL 3.8 10/18/2019 1421   LDLCALC 75 10/18/2019 1421    CBC    Component Value Date/Time   WBC 6.7 05/19/2021 1024   RBC 4.89 05/19/2021 1024   HGB 13.3 05/19/2021 1024   HGB 12.9 01/30/2021 1655   HCT 42.2 05/19/2021 1024   HCT 41.0 01/30/2021 1655   PLT 215 05/19/2021 1024   PLT 259 01/30/2021 1655   MCV 86.3 05/19/2021 1024   MCV 76 (L) 01/30/2021 1655   MCH 27.2 05/19/2021 1024   MCHC 31.5 05/19/2021 1024   RDW 15.1 05/19/2021 1024   RDW 20.5 (H) 12/18/2020 1554   LYMPHSABS 3.1 01/30/2021 1655   EOSABS 0.1 01/30/2021 1655   BASOSABS 0.0 01/30/2021 1655    ASSESSMENT AND PLAN:  1. Obesity (BMI 30.0-34.9) Commended her on weight loss so far.  Encouraged her to keep up the good work with healthy eating habits and regular exercise. - TSH  2. Fatty liver No need to repeat liver ultrasound at this time.  I told her  that the fatty liver will improved with continued weight loss and regular exercise.  3. Iron deficiency anemia due to chronic blood loss Recheck CBC today.  I will advise on whether she needs to continue iron based on results. - CBC  4. Need for immunization against influenza - Flu Vaccine QUAD 79mo+IM (  Fluarix, Fluzone & Alfiuria Quad PF)  5. Eyelash scantiness, unspecified laterality - TSH   Patient was given the opportunity to ask questions.  Patient verbalized understanding of the plan and was able to repeat key elements of the plan.  AMN Language interpreter used during this encounter. #[id number], [place]  Orders Placed This Encounter  Procedures   Flu Vaccine QUAD 49mo+IM (Fluarix, Fluzone & Alfiuria Quad PF)   CBC   TSH     Requested Prescriptions    No prescriptions requested or ordered in this encounter    Return in about 6 months (around 04/29/2022).  Jonah Blue, MD, FACP

## 2021-10-31 LAB — CBC
Hematocrit: 40.6 % (ref 34.0–46.6)
Hemoglobin: 13.2 g/dL (ref 11.1–15.9)
MCH: 26.3 pg — ABNORMAL LOW (ref 26.6–33.0)
MCHC: 32.5 g/dL (ref 31.5–35.7)
MCV: 81 fL (ref 79–97)
Platelets: 195 10*3/uL (ref 150–450)
RBC: 5.02 x10E6/uL (ref 3.77–5.28)
RDW: 14.7 % (ref 11.7–15.4)
WBC: 5.9 10*3/uL (ref 3.4–10.8)

## 2021-10-31 LAB — TSH: TSH: 1.65 u[IU]/mL (ref 0.450–4.500)

## 2021-11-05 ENCOUNTER — Ambulatory Visit: Payer: Self-pay

## 2021-11-13 ENCOUNTER — Ambulatory Visit: Payer: Self-pay

## 2021-12-17 ENCOUNTER — Other Ambulatory Visit: Payer: Self-pay

## 2021-12-17 DIAGNOSIS — Z1231 Encounter for screening mammogram for malignant neoplasm of breast: Secondary | ICD-10-CM

## 2022-01-12 ENCOUNTER — Other Ambulatory Visit: Payer: Self-pay | Admitting: Internal Medicine

## 2022-01-12 DIAGNOSIS — D649 Anemia, unspecified: Secondary | ICD-10-CM

## 2022-01-12 NOTE — Telephone Encounter (Signed)
Requested Prescriptions  Pending Prescriptions Disp Refills   ferrous sulfate 325 (65 FE) MG EC tablet [Pharmacy Med Name: Ferrous Sulfate 325 (65 Fe) MG Oral Tablet Delayed Release] 60 tablet 0    Sig: TAKE 1 TABLET BY MOUTH TWICE DAILY WITH A MEAL     Endocrinology:  Minerals - Iron Supplementation Passed - 01/12/2022  2:33 AM      Passed - HGB in normal range and within 360 days    Hemoglobin  Date Value Ref Range Status  10/30/2021 13.2 11.1 - 15.9 g/dL Final         Passed - HCT in normal range and within 360 days    Hematocrit  Date Value Ref Range Status  10/30/2021 40.6 34.0 - 46.6 % Final         Passed - RBC in normal range and within 360 days    RBC  Date Value Ref Range Status  10/30/2021 5.02 3.77 - 5.28 x10E6/uL Final  05/19/2021 4.89 3.87 - 5.11 MIL/uL Final         Passed - Fe (serum) in normal range and within 360 days    Iron  Date Value Ref Range Status  01/30/2021 32 27 - 159 ug/dL Final   Iron Saturation  Date Value Ref Range Status  01/30/2021 6 (LL) 15 - 55 % Final         Passed - Ferritin in normal range and within 360 days    Ferritin  Date Value Ref Range Status  01/30/2021 43 15 - 150 ng/mL Final         Passed - Valid encounter within last 12 months    Recent Outpatient Visits          2 months ago Obesity (BMI 30.0-34.9)   [place], [place]   6 months ago Fatty liver   [place], [place]   11 months ago Iron deficiency anemia due to chronic blood loss   [place] Lad[place], MD   1 year ago [place], [place]   2 years ago RUQ pain   [place], Dionne Bucy, [place]      Future Appointments            In 1 month Wynetta Emery, Dalbert Batman, MD [place]

## 2022-02-12 ENCOUNTER — Ambulatory Visit
Admission: RE | Admit: 2022-02-12 | Discharge: 2022-02-12 | Disposition: A | Discharge status: Home / Self Care | Payer: No Typology Code available for payment source | Source: Ambulatory Visit | Attending: Obstetrics and Gynecology | Admitting: Obstetrics and Gynecology

## 2022-02-12 ENCOUNTER — Other Ambulatory Visit: Payer: Self-pay

## 2022-02-12 ENCOUNTER — Ambulatory Visit: Payer: Self-pay | Admitting: *Deleted

## 2022-02-12 VITALS — BP 108/70 | Wt 164.6 lb

## 2022-02-12 DIAGNOSIS — Z01419 Encounter for gynecological examination (general) (routine) without abnormal findings: Secondary | ICD-10-CM

## 2022-02-12 DIAGNOSIS — Z1231 Encounter for screening mammogram for malignant neoplasm of breast: Secondary | ICD-10-CM

## 2022-02-12 NOTE — Patient Instructions (Signed)
Explained breast self awareness with Nigel Bridgeman. Pap smear completed today. Let her know B[facility]CP will cover Pap smears and HPV typing every 5 years unless has a history of abnormal Pap smears. Referred patient to the [facility] for a screening mammogram on mobile unit. Appointment scheduled Thursday, February 12, 2022 at 1520. Patient aware of appointment and will be there. Let patient know will follow up with her within the next couple weeks with results of Pap smear by phone. Informed patient that the Breast Center will follow up with her within the next couple of weeks with results of her mammogram by letter or phone. Miranda Welch verbalized understanding. ? ?Sapphire Tygart, Kathaleen Maser, RN ?2:36 PM ? ? ? ? ?

## 2022-02-12 NOTE — Progress Notes (Signed)
[name] is a 41 y.o. [id number] female who presents to [facility] clinic today with no complaints.  ?  ?Pap Smear: Pap smear completed today. Last Pap smear was 02/06/2019 at [facility]. And was normal. Per patient has no history of an abnormal Pap smear. Last Pap smear result is available in Epic. ?  ?Physical exam: ?Breasts ?Breasts symmetrical. No skin abnormalities bilateral breasts. No nipple retraction bilateral breasts. No nipple discharge bilateral breasts. No lymphadenopathy. No lumps palpated bilateral breasts. No complaints of pain or tenderness on exam.     ? ?MS DIGITAL SCREENING TOMO BILATERAL ? ?Result Date: 02/19/2020 ?CLINICAL DATA:  Screening. EXAM: DIGITAL SCREENING BILATERAL MAMMOGRAM WITH TOMO AND CAD COMPARISON:  None. ACR Breast Density Category c: The breast tissue is heterogeneously dense, which may obscure small masses FINDINGS: There are no findings suspicious for malignancy. Images were processed with CAD. IMPRESSION: No mammographic evidence of malignancy. A result letter of this screening mammogram will be mailed directly to the patient. RECOMMENDATION: Screening mammogram in one year. (Code:SM-B-01Y) BI-RADS CATEGORY  1: Negative. Electronically Signed   By: Miranda Welch M.D   On: 02/19/2020 10:33  ? ?MS DIGITAL DIAG TOMO BILAT ? ?Result Date: 01/09/2021 ?CLINICAL DATA:  Patient presents with bilateral breast pain. No reported lumps. EXAM: DIGITAL DIAGNOSTIC BILATERAL MAMMOGRAM WITH TOMO AND CAD TECHNIQUE: Bilateral digital diagnostic mammography and breast tomosynthesis was performed. Digital images of the breasts were evaluated with computer-aided detection. COMPARISON:  None. ACR Breast Density Category b: There are scattered areas of fibroglandular density. FINDINGS: There are no masses, areas of architectural distortion, areas of significant asymmetry or suspicious calcifications. IMPRESSION: Negative exam.  No evidence of breast malignancy.  RECOMMENDATION: Screening mammogram in one year.(Code:SM-B-01Y) I have discussed the findings and recommendations with the patient. If applicable, a reminder letter will be sent to the patient regarding the next appointment. BI-RADS CATEGORY  1: Negative. Electronically Signed   By: Miranda Welch M.D.   On: 01/09/2021 10:02   ? ?Pelvic/Bimanual ?Ext Genitalia ?No lesions, no swelling and no discharge observed on external genitalia.      ?  ?Vagina ?Vagina pink and normal texture. No lesions or discharge observed in vagina.      ?  ?Cervix ?Cervix is present. Cervix pink and of normal texture. Cervix friable and reddened around os. No discharge observed.  ?  ?Uterus ?Uterus is present and palpable. Uterus in normal position and normal size.      ?  ?Adnexae ?Bilateral ovaries present and palpable. No tenderness on palpation.       ?  ?Rectovaginal ?No rectal exam completed today since patient had no rectal complaints. No skin abnormalities observed on exam.   ?  ?Smoking History: ?Patient has never smoked. ?  ?Patient Navigation: ?Patient education provided. Access to services provided for patient through [facility] program. Spanish interpreter Miranda Welch from [facility] provided.  ?  ?Breast and Cervical Cancer Risk Assessment: ?Patient has family history of maternal first cousin having breast cancer. Patient has no known genetic mutations or history of radiation treatment to the chest before age 40. Patient does not have history of cervical dysplasia, immunocompromised, or DES exposure in-utero. ? ?Risk Assessment   ? ? Risk Scores   ? ?   02/12/2022 01/09/2021  ? Last edited by: Miranda Revel, LPN [place], Miranda Lemon, LPN  ? 5-year risk: 0.4 % 0.4 %  ? Lifetime risk: 6.3 % 6.3 %  ? ?  ?  ? ?  ? ? ?  A: ?BCCCP exam with pap smear ?No complaints. ? ?P: ?Referred patient to the [facility] for a screening mammogram on mobile unit. Appointment scheduled Thursday, February 12, 2022 at 1520. ? ?Miranda Parish,  RN ?02/12/2022 2:36 PM   ?

## 2022-02-13 LAB — CYTOLOGY - PAP
Comment: NEGATIVE
Diagnosis: NEGATIVE
High risk HPV: NEGATIVE

## 2022-02-16 ENCOUNTER — Telehealth: Payer: Self-pay

## 2022-02-16 NOTE — Telephone Encounter (Signed)
Called patient via [employer] #[phone] to give pap smear results. Informed patient that pap smear was normal and HPV was negative. Based on this result her next pap smear will be due in 5 years. Patient voiced understanding.  ?

## 2022-02-27 ENCOUNTER — Ambulatory Visit: Payer: Self-pay | Admitting: Internal Medicine

## 2022-04-14 ENCOUNTER — Ambulatory Visit: Payer: No Typology Code available for payment source | Admitting: Obstetrics and Gynecology

## 2022-11-30 ENCOUNTER — Ambulatory Visit: Payer: Self-pay | Attending: Internal Medicine | Admitting: Internal Medicine

## 2022-11-30 ENCOUNTER — Encounter: Payer: Self-pay | Admitting: Internal Medicine

## 2022-11-30 VITALS — BP 140/90 | HR 66 | Temp 97.8°F | Ht 60.0 in | Wt 172.0 lb

## 2022-11-30 DIAGNOSIS — F410 Panic disorder [episodic paroxysmal anxiety] without agoraphobia: Secondary | ICD-10-CM

## 2022-11-30 DIAGNOSIS — Z1159 Encounter for screening for other viral diseases: Secondary | ICD-10-CM

## 2022-11-30 DIAGNOSIS — Z6833 Body mass index (BMI) 33.0-33.9, adult: Secondary | ICD-10-CM

## 2022-11-30 DIAGNOSIS — Z23 Encounter for immunization: Secondary | ICD-10-CM

## 2022-11-30 DIAGNOSIS — E669 Obesity, unspecified: Secondary | ICD-10-CM

## 2022-11-30 DIAGNOSIS — I10 Essential (primary) hypertension: Secondary | ICD-10-CM

## 2022-11-30 DIAGNOSIS — K760 Fatty (change of) liver, not elsewhere classified: Secondary | ICD-10-CM

## 2022-11-30 DIAGNOSIS — N926 Irregular menstruation, unspecified: Secondary | ICD-10-CM

## 2022-11-30 NOTE — Progress Notes (Signed)
Patient ID: Miranda Welch, female    DOB: 05-07-1981  MRN: [id number]  CC: Follow-up (Fatty liver f/u. /Unintentional weight gain. Pain on right side of abdomen around liver area. Cramps on legs X3 weeks./Irregular menstrual cycles. Experienced 2 panic attacks this year./Yes to flu vax.)   Subjective: Miranda Welch is a 40 y.o. female who presents for chronic ds management Her concerns today include:  Hx of obesity, Vit D def, IDA, preDM   Wants to have liver recheck due to hx of fatty liver Gained 15 lbs in the past yr. She was going to gym for 1 hr daily but stopped going in June.  Just started going again 1 mth ago for 5 days/wk  Had 2 panic attacks this yr.  One when her mom had a stroke 01/2022 and when she past 05/2022.  She has not had any episodes since then. PQH9 and GAD 7 negative today  C/o irregular menses x 3 mths where she has bleeding twice each month.  Menses would come and last 5 days very heavy.  Then 5 days later it would come again and last 3 days but not as heavy as initial period.   Blood pressure noted to be elevated today.  No prior history of blood pressure elevation.  HM:  need flu shot and screening for hep C Patient Active Problem List   Diagnosis Date Noted   Fatty liver 06/25/2021   Abdominal pain, epigastric 02/25/2021   Gastroesophageal reflux disease without esophagitis 02/25/2021   Influenza vaccine refused 01/30/2021   Carpal tunnel syndrome on right    Carpal tunnel syndrome on left    Vitamin D deficiency 12/19/2020   Prediabetes 12/19/2020   Fatigue 12/19/2020   Anemia 12/19/2020   Abnormal uterine bleeding (AUB) 06/15/2019   Shoulder pain, right 05/06/2012   Obesity 05/06/2012   PALPITATIONS 02/10/2007     Current Outpatient Medications on File Prior to Visit  Medication Sig Dispense Refill   cholecalciferol (VITAMIN D3) 25 MCG (1000 UNIT) tablet Take 2,000 Units by mouth daily. (Patient not taking: Reported on  11/30/2022)     ferrous sulfate 325 (65 FE) MG EC tablet TAKE 1 TABLET BY MOUTH TWICE DAILY WITH A MEAL (Patient not taking: Reported on 11/30/2022) 60 tablet 0   pantoprazole (PROTONIX) 40 MG tablet Take 1 tablet (40 mg total) by mouth daily. (Patient not taking: Reported on 11/30/2022) 30 tablet 3   No current facility-administered medications on file prior to visit.    No Known Allergies  Social History   Socioeconomic History   Marital status: Married    Spouse name: Not on file   Number of children: 3   Years of education: 12   Highest education level: High school graduate  Occupational History   Not on file  Tobacco Use   Smoking status: Never   Smokeless tobacco: Never  Vaping Use   Vaping Use: Never used  Substance and Sexual Activity   Alcohol use: Yes    Comment: occasionally   Drug use: No   Sexual activity: Not Currently    Birth control/protection: None  Other Topics Concern   Not on file  Social History Narrative   Moved from [place] 10 yrs ago, lives with husband and 2 daughters (2011, 2004). Stay at home mother. Walks for exercise irregularly.   Social Determinants of Health   Financial Resource Strain: Not on file  Food Insecurity: No Food Insecurity (02/12/2022)   Hunger Vital Sign  Worried About Programme researcher, broadcasting/film/video in the Last Year: Never true    Ran Out of Food in the Last Year: Never true  Transportation Needs: No Transportation Needs (02/12/2022)   PRAPARE - Administrator, Civil Service (Medical): No    Lack of Transportation (Non-Medical): No  Physical Activity: Not on file  Stress: Not on file  Social Connections: Not on file  Intimate Partner Violence: Not on file    Family History  Problem Relation Age of Onset   Diabetes Mother    Hypertension Mother    Stroke Mother    Diabetes Father    Diabetes Sister    Diabetes Maternal Grandmother    Diabetes Maternal Grandfather    Breast cancer Cousin     Past Surgical  History:  Procedure Laterality Date   CARPAL TUNNEL RELEASE Right 12/25/2020   Procedure: RIGHT CARPAL TUNNEL RELEASE, LEFT CARPAL TUNNEL INJECTION;  Surgeon: Tarry Kos, MD;  Location: [facility];  Service: Orthopedics;  Laterality: Right;   NO PAST SURGERIES      ROS: Review of Systems Negative except as stated above  PHYSICAL EXAM: BP (!) 140/90   Pulse 66   Temp 97.8 F (36.6 C) (Oral)   Ht 5' (1.524 m)   Wt 172 lb (78 kg)   SpO2 97%   BMI 33.59 kg/m   Wt Readings from Last 3 Encounters:  11/30/22 172 lb (78 kg)  02/12/22 164 lb 9.6 oz (74.7 kg)  10/30/21 157 lb 6.4 oz (71.4 kg)    Physical Exam  General appearance - alert, well appearing, and in no distress Mental status - normal mood, behavior, speech, dress, motor activity, and thought processes Neck - supple, no significant adenopathy Chest - clear to auscultation, no wheezes, rales or rhonchi, symmetric air entry Heart - normal rate, regular rhythm, normal S1, S2, no murmurs, rubs, clicks or gallops Abdomen: Normal bowel sounds, soft nontender no organomegaly. Extremities - peripheral pulses normal, no pedal edema, no clubbing or cyanosis    11/30/2022    4:59 PM 10/30/2021    9:26 AM 01/30/2021    4:00 PM 06/12/2020    1:40 PM  GAD 7 : Generalized Anxiety Score  Nervous, Anxious, on Edge 0 0 0 0  Control/stop worrying 0 0 0 0  Worry too much - different things 0 0 0 0  Trouble relaxing 0 0 0 0  Restless 0 0 0 0  Easily annoyed or irritable 0 0 0 0  Afraid - awful might happen 0 0 0 0  Total GAD 7 Score 0 0 0 0      11/30/2022    4:59 PM 10/30/2021    9:26 AM 06/25/2021    3:47 PM  Depression screen PHQ 2/9  Decreased Interest 0 0 0  Down, Depressed, Hopeless 0 0 0  PHQ - 2 Score 0 0 0  Altered sleeping 0    Tired, decreased energy 0    Change in appetite 0    Feeling bad or failure about yourself  0    Trouble concentrating 0    Moving slowly or fidgety/restless 0    Suicidal  thoughts 0    PHQ-9 Score 0           Latest Ref Rng & Units 05/19/2021   10:24 AM 12/28/2019    8:38 AM 10/18/2019    2:21 PM  CMP  Glucose 70 - 99 mg/dL 83  83  101   BUN 6 - 20 mg/dL 18  17  17    Creatinine 0.44 - 1.00 mg/dL 1.610.78  0.960.80  0.450.69   Sodium 135 - 145 mmol/L 136  138  138   Potassium 3.5 - 5.1 mmol/L 4.2  4.8  4.3   Chloride 98 - 111 mmol/L 104  103  102   CO2 22 - 32 mmol/L 23  20  22    Calcium 8.9 - 10.3 mg/dL 9.1  9.2  9.7   Total Protein 6.5 - 8.1 g/dL 7.0  7.1  7.1   Total Bilirubin 0.3 - 1.2 mg/dL 0.7  <4.0<0.2  <9.8<0.2   Alkaline Phos 38 - 126 U/L 54  53  66   AST 15 - 41 U/L 27  15  15    ALT 0 - 44 U/L 27  11  13     Lipid Panel     Component Value Date/Time   CHOL 162 10/18/2019 1421   TRIG 268 (H) 10/18/2019 1421   HDL 43 10/18/2019 1421   CHOLHDL 3.8 10/18/2019 1421   LDLCALC 75 10/18/2019 1421    CBC    Component Value Date/Time   WBC 5.9 10/30/2021 0954   WBC 6.7 05/19/2021 1024   RBC 5.02 10/30/2021 0954   RBC 4.89 05/19/2021 1024   HGB 13.2 10/30/2021 0954   HCT 40.6 10/30/2021 0954   PLT 195 10/30/2021 0954   MCV 81 10/30/2021 0954   MCH 26.3 (L) 10/30/2021 0954   MCH 27.2 05/19/2021 1024   MCHC 32.5 10/30/2021 0954   MCHC 31.5 05/19/2021 1024   RDW 14.7 10/30/2021 0954   LYMPHSABS 3.1 01/30/2021 1655   EOSABS 0.1 01/30/2021 1655   BASOSABS 0.0 01/30/2021 1655    ASSESSMENT AND PLAN:  1. Obesity (BMI 30.0-34.9) Patient advised to eliminate sugary drinks from the diet, cut back on portion sizes especially of white carbohydrates, eat more white lean meat like chicken [place]urkey and seafood instead of beef or pork and incorporate fresh fruits and vegetables into the diet daily. Commended her for getting back in the gym.  Advised that the goal is to get in at least 30 minutes 5 days a week of moderate intensity exercise. - CBC - Comprehensive metabolic panel - Lipid panel - Hemoglobin A1c  2. Fatty liver Discussed the importance of weight  loss.  3. Panic attack It seems as though these 2 episodes occur in the setting of extreme emotional upset.  She has not had any recurrent episodes since then.  I told her that we can monitor for now as it is not uncommon to have increased anxiety doing an extremely emotional event  4. Elevated blood pressure reading in office with diagnosis of hypertension DASH diet discussed and encouraged  5. Need for immunization against influenza - Flu Vaccine QUAD 375mo+IM (Fluarix, Fluzone & Alfiuria Quad PF)  6. Need for hepatitis C screening test - Hepatitis C Antibody 7.  Irregular menses -She is up-to-date with her Pap smear. Advised to apply for the orange card/cone discount card.  Once approved we can refer her for pelvic ultrasound.  Will have her follow-up in about 6 to 7 weeks.  Hopefully at that time she would have been approved for the orange card.  Advised to keep a calendar  of her menstrual cycle  [employer] Language interpreter used during this encounter. #Lilliana [phone]  Patient was given the opportunity to ask questions.  Patient verbalized understanding of the plan and was able to  repeat key elements of the plan.   This documentation was completed using Paediatric nurse.  Any transcriptional errors are unintentional.  Orders Placed This Encounter  Procedures   Flu Vaccine QUAD 61mo+IM (Fluarix, Fluzone & Alfiuria Quad PF)   CBC   Comprehensive metabolic panel   Lipid panel   Hemoglobin A1c   Hepatitis C Antibody     Requested Prescriptions    No prescriptions requested or ordered in this encounter    Return in about 6 weeks (around 01/11/2023).  Jonah Blue, MD, FACP

## 2022-12-01 LAB — COMPREHENSIVE METABOLIC PANEL
ALT: 16 IU/L (ref 0–32)
AST: 16 IU/L (ref 0–40)
Albumin/Globulin Ratio: 1.5 (ref 1.2–2.2)
Albumin: 4.3 g/dL (ref 3.9–4.9)
Alkaline Phosphatase: 71 IU/L (ref 44–121)
BUN/Creatinine Ratio: 24 — ABNORMAL HIGH (ref 9–23)
BUN: 19 mg/dL (ref 6–24)
Bilirubin Total: <0.2 mg/dL (ref 0.0–1.2)
CO2: 24 mmol/L (ref 20–29)
Calcium: 9.6 mg/dL (ref 8.7–10.2)
Chloride: 104 mmol/L (ref 96–106)
Creatinine, Ser: 0.8 mg/dL (ref 0.57–1.00)
Globulin, Total: 2.8 g/dL (ref 1.5–4.5)
Glucose: 104 mg/dL — ABNORMAL HIGH (ref 70–99)
Potassium: 4.1 mmol/L (ref 3.5–5.2)
Sodium: 142 mmol/L (ref 134–144)
Total Protein: 7.1 g/dL (ref 6.0–8.5)
eGFR: 94 mL/min/{1.73_m2} (ref >59)

## 2022-12-01 LAB — LIPID PANEL
Chol/HDL Ratio: 4.7 ratio — ABNORMAL HIGH (ref 0.0–4.4)
Cholesterol, Total: 188 mg/dL (ref 100–199)
HDL: 40 mg/dL (ref >39)
LDL Chol Calc (NIH): 99 mg/dL (ref 0–99)
Triglycerides: 291 mg/dL — ABNORMAL HIGH (ref 0–149)
VLDL Cholesterol Cal: 49 mg/dL — ABNORMAL HIGH (ref 5–40)

## 2022-12-01 LAB — CBC
Hematocrit: 37 % (ref 34.0–46.6)
Hemoglobin: 11.5 g/dL (ref 11.1–15.9)
MCH: 23 pg — ABNORMAL LOW (ref 26.6–33.0)
MCHC: 31.1 g/dL — ABNORMAL LOW (ref 31.5–35.7)
MCV: 74 fL — ABNORMAL LOW (ref 79–97)
Platelets: 240 10*3/uL (ref 150–450)
RBC: 5 x10E6/uL (ref 3.77–5.28)
RDW: 16.2 % — ABNORMAL HIGH (ref 11.7–15.4)
WBC: 8.7 10*3/uL (ref 3.4–10.8)

## 2022-12-01 LAB — HEMOGLOBIN A1C
Est. average glucose Bld gHb Est-mCnc: 126 mg/dL
Hgb A1c MFr Bld: 6 % — ABNORMAL HIGH (ref 4.8–5.6)

## 2022-12-01 LAB — HEPATITIS C ANTIBODY: Hep C Virus Ab: NONREACTIVE

## 2022-12-02 ENCOUNTER — Other Ambulatory Visit: Payer: Self-pay | Admitting: Internal Medicine

## 2022-12-02 ENCOUNTER — Other Ambulatory Visit: Payer: Self-pay

## 2022-12-02 DIAGNOSIS — D649 Anemia, unspecified: Secondary | ICD-10-CM

## 2022-12-02 MED ORDER — FERROUS SULFATE 325 (65 FE) MG PO TBEC
1.0000 | DELAYED_RELEASE_TABLET | Freq: Every day | ORAL | 0 refills | Status: DC
  Filled 2022-12-02: qty 90, 90d supply, fill #0

## 2022-12-17 ENCOUNTER — Other Ambulatory Visit: Payer: No Typology Code available for payment source

## 2023-01-12 ENCOUNTER — Encounter: Payer: Self-pay | Admitting: Internal Medicine

## 2023-01-12 ENCOUNTER — Ambulatory Visit: Payer: Self-pay | Attending: Internal Medicine | Admitting: Internal Medicine

## 2023-01-12 VITALS — BP 125/74 | HR 67 | Temp 97.9°F | Ht 60.0 in | Wt 169.0 lb

## 2023-01-12 DIAGNOSIS — B369 Superficial mycosis, unspecified: Secondary | ICD-10-CM

## 2023-01-12 DIAGNOSIS — R21 Rash and other nonspecific skin eruption: Secondary | ICD-10-CM

## 2023-01-12 DIAGNOSIS — Z1231 Encounter for screening mammogram for malignant neoplasm of breast: Secondary | ICD-10-CM

## 2023-01-12 DIAGNOSIS — N926 Irregular menstruation, unspecified: Secondary | ICD-10-CM

## 2023-01-12 MED ORDER — TRIAMCINOLONE ACETONIDE 0.1 % EX CREA: 1.0000 | TOPICAL_CREAM | Freq: Two times a day (BID) | CUTANEOUS | 0 refills | Status: AC

## 2023-01-12 MED ORDER — NYSTATIN 100000 UNIT/GM EX CREA: 1.0000 | TOPICAL_CREAM | Freq: Two times a day (BID) | CUTANEOUS | 1 refills | Status: AC
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
## 2023-01-19 ENCOUNTER — Other Ambulatory Visit: Payer: Self-pay | Admitting: Internal Medicine

## 2023-01-19 DIAGNOSIS — D649 Anemia, unspecified: Secondary | ICD-10-CM

## 2023-01-20 NOTE — Telephone Encounter (Signed)
Rx 12/02/22 #90 Requested Prescriptions  Pending Prescriptions Disp Refills   ferrous sulfate 325 (65 FE) MG EC tablet [Pharmacy Med Name: Ferrous Sulfate 325 (65 Fe) MG Oral Tablet Delayed Release] 60 tablet 0    Sig: TAKE 1 TABLET BY MOUTH TWICE DAILY WITH A MEAL     Endocrinology:  Minerals - Iron Supplementation Failed - 01/19/2023  6:51 AM      Failed - Fe (serum) in normal range and within 360 days    Iron  Date Value Ref Range Status  01/30/2021 32 27 - 159 ug/dL Final   Iron Saturation  Date Value Ref Range Status  01/30/2021 6 (LL) 15 - 55 % Final         Failed - Ferritin in normal range and within 360 days    Ferritin  Date Value Ref Range Status  01/30/2021 43 15 - 150 ng/mL Final         Passed - HGB in normal range and within 360 days    Hemoglobin  Date Value Ref Range Status  11/30/2022 11.5 11.1 - 15.9 g/dL Final         Passed - HCT in normal range and within 360 days    Hematocrit  Date Value Ref Range Status  11/30/2022 37.0 34.0 - 46.6 % Final         Passed - RBC in normal range and within 360 days    RBC  Date Value Ref Range Status  11/30/2022 5.00 3.77 - 5.28 x10E6/uL Final  05/19/2021 4.89 3.87 - 5.11 MIL/uL Final         Passed - Valid encounter within last 12 months    Recent Outpatient Visits           1 week ago Irregular menses   [facility], [place]   1 month ago Obesity (BMI 30.0-34.9)   [place] Karle Plumber B, MD   1 year ago Obesity (BMI 30.0-34.9)   [place] [facility]ell [facility], MD   1 year ago Fatty liver   [place], [place]   1 year ago Iron deficiency anemia due to chronic blood loss   [place], MD       Future Appointments             In 3 months Wynetta Emery, Dalbert Batman, MD [place]

## 2023-01-22 ENCOUNTER — Other Ambulatory Visit: Payer: Self-pay | Admitting: Internal Medicine

## 2023-01-22 ENCOUNTER — Ambulatory Visit (HOSPITAL_COMMUNITY)
Admission: RE | Admit: 2023-01-22 | Discharge: 2023-01-22 | Disposition: A | Discharge status: Home / Self Care | Payer: Self-pay | Source: Ambulatory Visit | Attending: Internal Medicine | Admitting: Internal Medicine

## 2023-01-22 DIAGNOSIS — R9389 Abnormal findings on diagnostic imaging of other specified body structures: Secondary | ICD-10-CM

## 2023-01-22 DIAGNOSIS — N926 Irregular menstruation, unspecified: Secondary | ICD-10-CM

## 2023-01-22 DIAGNOSIS — D500 Iron deficiency anemia secondary to blood loss (chronic): Secondary | ICD-10-CM

## 2023-02-01 ENCOUNTER — Telehealth: Payer: Self-pay | Admitting: Emergency Medicine

## 2023-02-01 NOTE — Telephone Encounter (Signed)
Copied from [place] [id number]. Topic: Referral - Status >> Feb 01, 2023 12:37 PM Cyndi Bender wrote: Reason for CRM: Pt stated she has yet to hear from anyone regarding the referral for Gynecology. Cb#  [phone]

## 2023-02-03 NOTE — Telephone Encounter (Signed)
Called & spoke to the patient. Verified name & DOB. Patient verified that she has been contacted by [facility] and has an appointment scheduled for 02/05/2023.

## 2023-02-18 ENCOUNTER — Ambulatory Visit
Admission: RE | Admit: 2023-02-18 | Discharge: 2023-02-18 | Disposition: A | Discharge status: Home / Self Care | Payer: No Typology Code available for payment source | Source: Ambulatory Visit | Attending: Internal Medicine | Admitting: Internal Medicine

## 2023-02-18 DIAGNOSIS — Z1231 Encounter for screening mammogram for malignant neoplasm of breast: Secondary | ICD-10-CM

## 2023-05-14 ENCOUNTER — Ambulatory Visit: Payer: Self-pay | Admitting: Internal Medicine

## 2023-05-17 ENCOUNTER — Encounter: Payer: Self-pay | Admitting: Internal Medicine

## 2023-05-17 ENCOUNTER — Ambulatory Visit: Payer: BLUE CROSS/BLUE SHIELD | Attending: Internal Medicine | Admitting: Internal Medicine

## 2023-05-17 VITALS — BP 117/79 | HR 77 | Temp 97.9°F | Ht 60.0 in | Wt 176.0 lb

## 2023-05-17 DIAGNOSIS — R252 Cramp and spasm: Secondary | ICD-10-CM

## 2023-05-17 DIAGNOSIS — E559 Vitamin D deficiency, unspecified: Secondary | ICD-10-CM | POA: Diagnosis not present

## 2023-05-17 DIAGNOSIS — D649 Anemia, unspecified: Secondary | ICD-10-CM

## 2023-05-17 MED ORDER — CYCLOBENZAPRINE HCL 5 MG PO TABS: 5.0000 mg | ORAL_TABLET | Freq: Every evening | ORAL | 1 refills | Status: AC | PRN

## 2023-05-17 NOTE — Progress Notes (Signed)
Patient ID: Miranda Welch, female    DOB: 09/10/1981  MRN: [id number]  CC: Follow-up (Follow-up. Med refills. /Intermittent pain on R side of abdomen X1 mo)   Subjective: Miranda Welch is a 42 y.o. female who presents for routine f/u Her concerns today include:  Hx of obesity, Vit D def, IDA, preDM    [employer] Language interpreter used during this encounter. #Carlos [id number]  Miranda Welch was last seen 12/2022 for irregular menses and US done.  US revealed thicken endometrium.  Referred to GYN at [facility].  Had negative EMB 03/02/2023.  Menses more regular now.   Ran out of iron supplement 2 mths ago.  Last CBC done mid March revealed H/H 13.1/40.8  C/o Cramps in feet at nights x 3 wks and feeling like she has to stretch legs and moves legs in bed.    Would like to have Vit D level recheck.  Currently taking Vit D 1000 IU daily Patient Active Problem List   Diagnosis Date Noted   Fatty liver 06/25/2021   Abdominal pain, epigastric 02/25/2021   Gastroesophageal reflux disease without esophagitis 02/25/2021   Influenza vaccine refused 01/30/2021   Carpal tunnel syndrome on right    Carpal tunnel syndrome on left    Vitamin D deficiency 12/19/2020   Prediabetes 12/19/2020   Fatigue 12/19/2020   Anemia 12/19/2020   Abnormal uterine bleeding (AUB) 06/15/2019   Shoulder pain, right 05/06/2012   Obesity 05/06/2012   PALPITATIONS 02/10/2007     Current Outpatient Medications on File Prior to Visit  Medication Sig Dispense Refill   cholecalciferol (VITAMIN D3) 25 MCG (1000 UNIT) tablet Take 2,000 Units by mouth daily.     ferrous sulfate 325 (65 FE) MG EC tablet Take 1 tablet (325 mg total) by mouth daily with breakfast. 90 tablet 0   nystatin cream (MYCOSTATIN) Apply 1 Application topically 2 (two) times daily. 30 g 1   pantoprazole (PROTONIX) 40 MG tablet Take 1 tablet (40 mg total) by mouth daily. 30 tablet 3   triamcinolone cream (KENALOG) 0.1 % Apply 1 Application topically 2 (two) times  daily. For rash around the neck.  Do not apply to face. 30 g 0   No current facility-administered medications on file prior to visit.    No Known Allergies  Social History   Socioeconomic History   Marital status: Married    Spouse name: Not on file   Number of children: 3   Years of education: 12   Highest education level: High school graduate  Occupational History   Not on file  Tobacco Use   Smoking status: Never   Smokeless tobacco: Never  Vaping Use   Vaping Use: Never used  Substance and Sexual Activity   Alcohol use: Yes    Comment: occasionally   Drug use: No   Sexual activity: Not Currently    Birth control/protection: None  Other Topics Concern   Not on file  Social History Narrative   Moved from [place] 10 yrs ago, lives with husband and 2 daughters (2011, 2004). Stay at home mother. Walks for exercise irregularly.   Social Determinants of Health   Financial Resource Strain: Not on file  Food Insecurity: No Food Insecurity (02/12/2022)   Hunger Vital Sign    Worried About Running Out of Food in the Last Year: Never true    Ran Out of Food in the Last Year: Never true  Transportation Needs: No Transportation Needs (02/12/2022)   PRAPARE - Transportation  Lack of Transportation (Medical): No    Lack of Transportation (Non-Medical): No  Physical Activity: Not on file  Stress: Not on file  Social Connections: Not on file  Intimate Partner Violence: Not on file    Family History  Problem Relation Age of Onset   Diabetes Mother    Hypertension Mother    Stroke Mother    Diabetes Father    Diabetes Sister    Diabetes Maternal Grandmother    Diabetes Maternal Grandfather    Breast cancer Cousin     Past Surgical History:  Procedure Laterality Date   CARPAL TUNNEL RELEASE Right 12/25/2020   Procedure: RIGHT CARPAL TUNNEL RELEASE, LEFT CARPAL TUNNEL INJECTION;  Surgeon: Tarry Kos, MD;  Location: [facility];  Service: Orthopedics;   Laterality: Right;   NO PAST SURGERIES      ROS: Review of Systems Negative except as stated above  PHYSICAL EXAM: BP 117/79 (BP Location: Left Arm, Patient Position: Sitting, Cuff Size: Normal)   Pulse 77   Temp 97.9 F (36.6 C) (Oral)   Ht 5' (1.524 m)   Wt 176 lb (79.8 kg)   SpO2 97%   BMI 34.37 kg/m   Physical Exam  General appearance - alert, well appearing, and in no distress Mental status - normal mood, behavior, speech, dress, motor activity, and thought processes Musculoskeletal - feet warm and good DP/Miranda Welch pulses      Latest Ref Rng & Units 11/30/2022    4:50 PM 05/19/2021   10:24 AM 12/28/2019    8:38 AM  CMP  Glucose 70 - 99 mg/dL 161  83  83   BUN 6 - 24 mg/dL 19  18  17    Creatinine 0.57 - 1.00 mg/dL 0.96  0.45  4.09   Sodium 134 - 144 mmol/L 142  136  138   Potassium 3.5 - 5.2 mmol/L 4.1  4.2  4.8   Chloride 96 - 106 mmol/L 104  104  103   CO2 20 - 29 mmol/L 24  23  20    Calcium 8.7 - 10.2 mg/dL 9.6  9.1  9.2   Total Protein 6.0 - 8.5 g/dL 7.1  7.0  7.1   Total Bilirubin 0.0 - 1.2 mg/dL <8.1  0.7  <1.9   Alkaline Phos 44 - 121 IU/L 71  54  53   AST 0 - 40 IU/L 16  27  15    ALT 0 - 32 IU/L 16  27  11     Lipid Panel     Component Value Date/Time   CHOL 188 11/30/2022 1650   TRIG 291 (H) 11/30/2022 1650   HDL 40 11/30/2022 1650   CHOLHDL 4.7 (H) 11/30/2022 1650   LDLCALC 99 11/30/2022 1650    CBC    Component Value Date/Time   WBC 8.7 11/30/2022 1650   WBC 6.7 05/19/2021 1024   RBC 5.00 11/30/2022 1650   RBC 4.89 05/19/2021 1024   HGB 11.5 11/30/2022 1650   HCT 37.0 11/30/2022 1650   PLT 240 11/30/2022 1650   MCV 74 (L) 11/30/2022 1650   MCH 23.0 (L) 11/30/2022 1650   MCH 27.2 05/19/2021 1024   MCHC 31.1 (L) 11/30/2022 1650   MCHC 31.5 05/19/2021 1024   RDW 16.2 (H) 11/30/2022 1650   LYMPHSABS 3.1 01/30/2021 1655   EOSABS 0.1 01/30/2021 1655   BASOSABS 0.0 01/30/2021 1655    ASSESSMENT AND PLAN:  1. Anemia, unspecified type We will  recheck CBC today  to see whether she needs to be back on iron supplement. - CBC  2. Leg cramps Advised patient that we will check CBC to make sure that she has not become anemic again as anemia can cause symptoms of restless leg.  We will also give a trial of low-dose Flexeril for her to take at bedtime as needed.  Advised that the medicine can cause drowsiness. - cyclobenzaprine (FLEXERIL) 5 MG tablet; Take 1 tablet (5 mg total) by mouth at bedtime as needed for muscle spasms.  Dispense: 30 tablet; Refill: 1  3. Vitamin D deficiency - VITAMIN D 25 Hydroxy (Vit-D Deficiency, Fractures)    Patient was given the opportunity to ask questions.  Patient verbalized understanding of the plan and was able to repeat key elements of the plan.   This documentation was completed using Paediatric nurse.  Any transcriptional errors are unintentional.  Orders Placed This Encounter  Procedures   CBC   VITAMIN D 25 Hydroxy (Vit-D Deficiency, Fractures)     Requested Prescriptions   Signed Prescriptions Disp Refills   cyclobenzaprine (FLEXERIL) 5 MG tablet 30 tablet 1    Sig: Take 1 tablet (5 mg total) by mouth at bedtime as needed for muscle spasms.    Return if symptoms worsen or fail to improve.  Jonah Blue, MD, FACP

## 2023-05-18 ENCOUNTER — Other Ambulatory Visit: Payer: Self-pay

## 2023-05-18 ENCOUNTER — Other Ambulatory Visit: Payer: Self-pay | Admitting: Internal Medicine

## 2023-05-18 DIAGNOSIS — D649 Anemia, unspecified: Secondary | ICD-10-CM

## 2023-05-18 LAB — VITAMIN D 25 HYDROXY (VIT D DEFICIENCY, FRACTURES): Vit D, 25-Hydroxy: 26 ng/mL — ABNORMAL LOW (ref 30.0–100.0)

## 2023-05-18 LAB — CBC
Hematocrit: 37.3 % (ref 34.0–46.6)
Hemoglobin: 11.8 g/dL (ref 11.1–15.9)
MCH: 24.5 pg — ABNORMAL LOW (ref 26.6–33.0)
MCHC: 31.6 g/dL (ref 31.5–35.7)
MCV: 77 fL — ABNORMAL LOW (ref 79–97)
Platelets: 242 10*3/uL (ref 150–450)
RBC: 4.82 x10E6/uL (ref 3.77–5.28)
RDW: 14.9 % (ref 11.7–15.4)
WBC: 7.6 10*3/uL (ref 3.4–10.8)

## 2023-05-18 MED ORDER — FERROUS SULFATE 325 (65 FE) MG PO TBEC
325.0000 mg | DELAYED_RELEASE_TABLET | ORAL | 0 refills | Status: AC
Start: 2023-05-18 — End: ?
  Filled 2023-05-18: qty 90, fill #0

## 2023-07-09 ENCOUNTER — Ambulatory Visit: Payer: BLUE CROSS/BLUE SHIELD | Admitting: Internal Medicine

## 2023-10-01 ENCOUNTER — Ambulatory Visit: Payer: BLUE CROSS/BLUE SHIELD | Admitting: Internal Medicine

## 2024-08-01 ENCOUNTER — Emergency Department (HOSPITAL_BASED_OUTPATIENT_CLINIC_OR_DEPARTMENT_OTHER)
Admission: EM | Admit: 2024-08-01 | Discharge: 2024-08-01 | Disposition: A | Discharge status: Home / Self Care | Attending: Emergency Medicine | Admitting: Emergency Medicine

## 2024-08-01 ENCOUNTER — Encounter (HOSPITAL_BASED_OUTPATIENT_CLINIC_OR_DEPARTMENT_OTHER): Payer: Self-pay

## 2024-08-01 ENCOUNTER — Other Ambulatory Visit: Payer: Self-pay

## 2024-08-01 ENCOUNTER — Emergency Department (HOSPITAL_BASED_OUTPATIENT_CLINIC_OR_DEPARTMENT_OTHER)

## 2024-08-01 DIAGNOSIS — W010XXA Fall on same level from slipping, tripping and stumbling without subsequent striking against object, initial encounter: Secondary | ICD-10-CM | POA: Insufficient documentation

## 2024-08-01 DIAGNOSIS — S060X1A Concussion with loss of consciousness of 30 minutes or less, initial encounter: Secondary | ICD-10-CM | POA: Insufficient documentation

## 2024-08-01 MED ORDER — KETOROLAC TROMETHAMINE 60 MG/2ML IM SOLN
30.0000 mg | Freq: Once | INTRAMUSCULAR | Status: AC
  Administered 2024-08-01: 30 mg via INTRAMUSCULAR
  Filled 2024-08-01: qty 2

## 2024-08-01 NOTE — ED Notes (Addendum)
Patient transported to CT and back without event.

## 2024-08-01 NOTE — ED Provider Notes (Signed)
 [place] [facility] Provider Note   CSN: [id number] Arrival date & time: 08/01/24  1612     Patient presents with: Head Injury   Miranda Welch is a 43 y.o. female.   Patient is a 43 year old female who presents with a headache.  She states that she was walking in a river this weekend.  She slipped on a rock and fell backward, hitting her head back on the rocks.  Per her husband who is at bedside, she had a positive loss of consciousness for about a minute.  She had 1 episode of vomiting about an hour after that.  She has had some ongoing nausea and has not been able to eat much since then.  This happened 2 days ago.  She has had ongoing pain to the back of her head and in her neck area.  She has been using Naprosyn  with some improvement in symptoms.  She called her PCPs office this morning who advised her to come to the ED to get a CT scan.  She is not on anticoagulants.  She denies any other injuries from the fall.  She denies any numbness or weakness to her extremities.  No witnessed seizure activity after the incident.       Prior to Admission medications   Medication Sig Start Date End Date Taking? Authorizing Provider  cholecalciferol (VITAMIN D3) 25 MCG (1000 UNIT) tablet Take 2,000 Units by mouth daily.    [provider]  cyclobenzaprine  (FLEXERIL ) 5 MG tablet Take 1 tablet (5 mg total) by mouth at bedtime as needed for muscle spasms. 05/17/23   Vicci Barnie NOVAK, MD  ferrous sulfate  325 (65 FE) MG EC tablet Take 1 tablet (325 mg total) by mouth every Monday, Wednesday, and Friday. 05/18/23   Vicci Barnie NOVAK, MD  nystatin  cream (MYCOSTATIN ) Apply 1 Application topically 2 (two) times daily. 01/12/23   Vicci Barnie NOVAK, MD  pantoprazole  (PROTONIX ) 40 MG tablet Take 1 tablet (40 mg total) by mouth daily. 02/25/21   Mayers, Cari S, PA-C  triamcinolone  cream (KENALOG ) 0.1 % Apply 1 Application topically 2 (two) times daily. For rash  around the neck.  Do not apply to face. 01/12/23   Vicci Barnie NOVAK, MD    Allergies: Patient has no known allergies.    Review of Systems  Constitutional:  Negative for chills, diaphoresis, fatigue and fever.  HENT:  Negative for congestion, rhinorrhea and sneezing.   Eyes: Negative.  Negative for visual disturbance.  Respiratory:  Negative for cough, chest tightness and shortness of breath.   Cardiovascular:  Negative for chest pain and leg swelling.  Gastrointestinal:  Positive for nausea and vomiting. Negative for abdominal pain, blood in stool and diarrhea.  Genitourinary:  Negative for difficulty urinating, flank pain and frequency.  Musculoskeletal:  Positive for neck pain. Negative for arthralgias and back pain.  Skin:  Negative for rash.  Neurological:  Positive for dizziness and headaches. Negative for speech difficulty, weakness and numbness.    Updated Vital Signs BP (!) 152/88   Pulse 78   Temp 98.4 F (36.9 C)   Resp 18   LMP 07/05/2024   SpO2 96%   Physical Exam Constitutional:      Appearance: She is well-developed.  HENT:     Head: Normocephalic and atraumatic.     Comments: Positive tenderness to the posterior scalp. Eyes:     Pupils: Pupils are equal, round, and reactive to light.  Neck:  Comments: Positive pain to the upper cervical spine, no step-offs or deformities, no pain to the thoracic or lumbosacral spine Cardiovascular:     Rate and Rhythm: Normal rate and regular rhythm.     Heart sounds: Normal heart sounds.  Pulmonary:     Effort: Pulmonary effort is normal. No respiratory distress.     Breath sounds: Normal breath sounds. No wheezing or rales.  Chest:     Chest wall: No tenderness.  Abdominal:     General: Bowel sounds are normal.     Palpations: Abdomen is soft.     Tenderness: There is no abdominal tenderness. There is no guarding or rebound.  Musculoskeletal:        General: Normal range of motion.  Lymphadenopathy:      Cervical: No cervical adenopathy.  Skin:    General: Skin is warm and dry.     Findings: No rash.  Neurological:     Mental Status: She is alert and oriented to person, place, and time.     Comments: Motor 5/5 all extremities Sensation grossly intact to LT all extremities Finger to Nose intact, no pronator drift CN II-XII grossly intact       (all labs ordered are listed, but only abnormal results are displayed) Labs Reviewed - No data to display  EKG: None  Radiology: CT Head Wo Contrast Result Date: 08/01/2024 CLINICAL DATA:  Head trauma, moderate-severe; Neck trauma, midline tenderness (Age 63-64y) EXAM: CT HEAD WITHOUT CONTRAST CT CERVICAL SPINE WITHOUT CONTRAST TECHNIQUE: Multidetector CT imaging of the head and cervical spine was performed following the standard protocol without intravenous contrast. Multiplanar CT image reconstructions of the cervical spine were also generated. RADIATION DOSE REDUCTION: This exam was performed according to the departmental dose-optimization program which includes automated exposure control, adjustment of the mA and/or kV according to patient size and/or use of iterative reconstruction technique. COMPARISON:  None Available. FINDINGS: CT HEAD FINDINGS Brain: No evidence of acute infarction, hemorrhage, hydrocephalus, extra-axial collection or mass lesion/mass effect. Vascular: No hyperdense vessel. Skull: No acute fracture. Sinuses/Orbits: Clear sinuses.  No acute orbital findings. Other: No mastoid effusions. CT CERVICAL SPINE FINDINGS Alignment: Normal. Skull base and vertebrae: No acute fracture. No primary bone lesion or focal pathologic process. Soft tissues and spinal canal: No prevertebral fluid or swelling. No visible canal hematoma. Disc levels:  No significant bony degenerative change. Upper chest: Visualized lung apices are clear. IMPRESSION: No acute intracranial or cervical spine findings. Electronically Signed   By: Gilmore GORMAN Molt M.D.    On: 08/01/2024 17:04   CT Cervical Spine Wo Contrast Result Date: 08/01/2024 CLINICAL DATA:  Head trauma, moderate-severe; Neck trauma, midline tenderness (Age 50-64y) EXAM: CT HEAD WITHOUT CONTRAST CT CERVICAL SPINE WITHOUT CONTRAST TECHNIQUE: Multidetector CT imaging of the head and cervical spine was performed following the standard protocol without intravenous contrast. Multiplanar CT image reconstructions of the cervical spine were also generated. RADIATION DOSE REDUCTION: This exam was performed according to the departmental dose-optimization program which includes automated exposure control, adjustment of the mA and/or kV according to patient size and/or use of iterative reconstruction technique. COMPARISON:  None Available. FINDINGS: CT HEAD FINDINGS Brain: No evidence of acute infarction, hemorrhage, hydrocephalus, extra-axial collection or mass lesion/mass effect. Vascular: No hyperdense vessel. Skull: No acute fracture. Sinuses/Orbits: Clear sinuses.  No acute orbital findings. Other: No mastoid effusions. CT CERVICAL SPINE FINDINGS Alignment: Normal. Skull base and vertebrae: No acute fracture. No primary bone lesion or focal pathologic process. Soft tissues  and spinal canal: No prevertebral fluid or swelling. No visible canal hematoma. Disc levels:  No significant bony degenerative change. Upper chest: Visualized lung apices are clear. IMPRESSION: No acute intracranial or cervical spine findings. Electronically Signed   By: Gilmore GORMAN Molt M.D.   On: 08/01/2024 17:04     Procedures   Medications Ordered in the ED  ketorolac  (TORADOL ) injection 30 mg (30 mg Intramuscular Given 08/01/24 1820)                                    Medical Decision Making Amount and/or Complexity of Data Reviewed Radiology: ordered.  Risk Prescription drug management.   This patient presents to the ED for concern of headache, this involves an extensive number of treatment options, and is a complaint  that carries with it a high risk of complications and morbidity.  I considered the following differential and admission for this acute, potentially life threatening condition.  The differential diagnosis includes concussion, intracranial hemorrhage, cervical spine fracture, skull fracture  MDM:    Patient is a 43 year old who presents with headache ongoing since she had a head injury 2 days ago.  She had some vomiting after the incident but none since then.  She does not have any focal neurologic deficits.  She is awake and alert and well-appearing.  She had CT scan of her head and cervical spine which showed no acute abnormality.  No intracranial hemorrhage.  No cervical spine fracture.  She is neurologically intact.  She was given an injection of Toradol  for her headache.  She was advised on symptomatic care.  Return precautions were given.  She was encouraged to follow-up with her PCP in 1 week for recheck.  Spanish interpreter video language line was used for encounter.  (Labs, imaging, consults)  Labs: I Ordered, and personally interpreted labs.  The pertinent results include: None  Imaging Studies ordered: I ordered imaging studies including CT head, cervical spine I independently visualized and interpreted imaging. I agree with the radiologist interpretation  Additional history obtained from husband.  External records from outside source obtained and reviewed including history  Cardiac Monitoring: The patient was not maintained on a cardiac monitor.  If on the cardiac monitor, I personally viewed and interpreted the cardiac monitored which showed an underlying rhythm of:    Reevaluation: After the interventions noted above, I reevaluated the patient and found that they have :improved  Social Determinants of Health:  Spanish-speaking  Disposition: Discharged to home  Co morbidities that complicate the patient evaluation  Past Medical History:  Diagnosis Date   Gestational  diabetes    Thickened endometrium 08/17/2019     Medicines Meds ordered this encounter  Medications   ketorolac  (TORADOL ) injection 30 mg    I have reviewed the patients home medicines and have made adjustments as needed  Problem List / ED Course: Problem List Items Addressed This Visit   None Visit Diagnoses       Concussion with loss of consciousness of 30 minutes or less, initial encounter    -  Primary                Final diagnoses:  Concussion with loss of consciousness of 30 minutes or less, initial encounter    ED Discharge Orders     None          Lenor Hollering, MD 08/01/24 1829

## 2024-08-01 NOTE — ED Triage Notes (Signed)
 Pt [place] on [place]. Slid in the river, fell back and hit back of head.Husband states she lost consciousness.Vomited on Sunday. Nauseated. Pain posterior head into neck. Pain with neck movement. Taking naproxen 

## 2025-01-17 ENCOUNTER — Other Ambulatory Visit: Payer: Self-pay | Admitting: Obstetrics and Gynecology

## 2025-01-17 DIAGNOSIS — Z1231 Encounter for screening mammogram for malignant neoplasm of breast: Secondary | ICD-10-CM

## 2025-03-15 ENCOUNTER — Ambulatory Visit

## 2025-03-15 ENCOUNTER — Encounter
